# Patient Record
Sex: Male | Born: 1985 | Race: Black or African American | Hispanic: No | Marital: Single | Smoking: Never smoker
Health system: Southern US, Community
[De-identification: ages and names within clinical notes are randomized; demographics above are authoritative.]

## PROBLEM LIST (undated history)

## (undated) ENCOUNTER — Emergency Department (HOSPITAL_COMMUNITY): Admission: EM | Payer: 59 | Source: Home / Self Care

## (undated) DIAGNOSIS — E7800 Pure hypercholesterolemia, unspecified: Secondary | ICD-10-CM

## (undated) HISTORY — PX: URETHRA SURGERY: SHX824

---
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
## 2007-04-01 ENCOUNTER — Encounter: Admission: RE | Admit: 2007-04-01 | Discharge: 2007-04-01 | Payer: Self-pay | Admitting: Family Medicine

## 2008-01-06 ENCOUNTER — Emergency Department (HOSPITAL_COMMUNITY): Admission: EM | Admit: 2008-01-06 | Discharge: 2008-01-06 | Payer: Self-pay | Admitting: Emergency Medicine

## 2008-05-13 ENCOUNTER — Emergency Department (HOSPITAL_COMMUNITY): Admission: EM | Admit: 2008-05-13 | Discharge: 2008-05-13 | Payer: Self-pay | Admitting: Emergency Medicine

## 2008-11-02 IMAGING — US US SCROTUM
1 series · 14 of 25 positions shown · non-contrast
Comparison: none

CLINICAL DATA: Left testicular lump on physical exam. 
SCROTAL ULTRASOUND:
TECHNIQUE: Complete ultrasound examination of the testicles, epididymis, and other scrotal structures was performed.

[Series 1: us scrotum · 0.08mm/px · 14 of 58 slices shown]
[im 1/58]
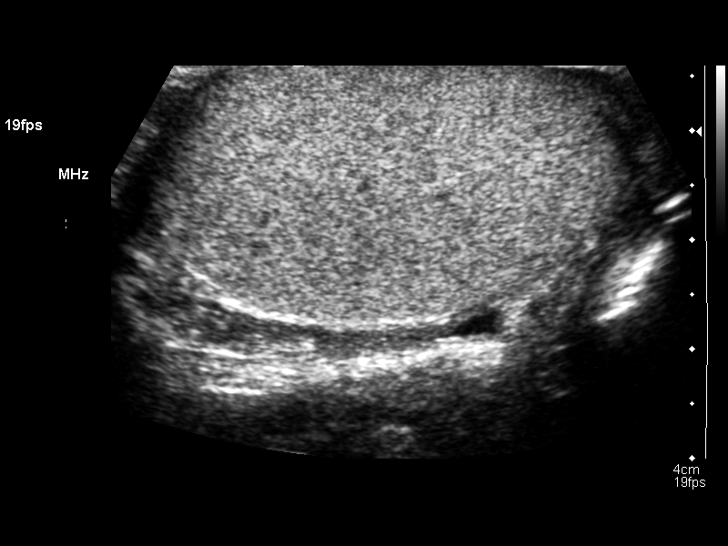
[im 5/58]
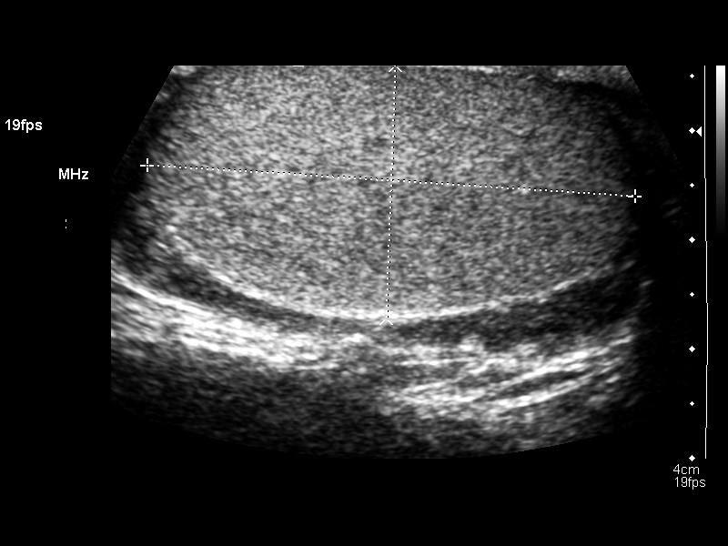
[im 10/58]
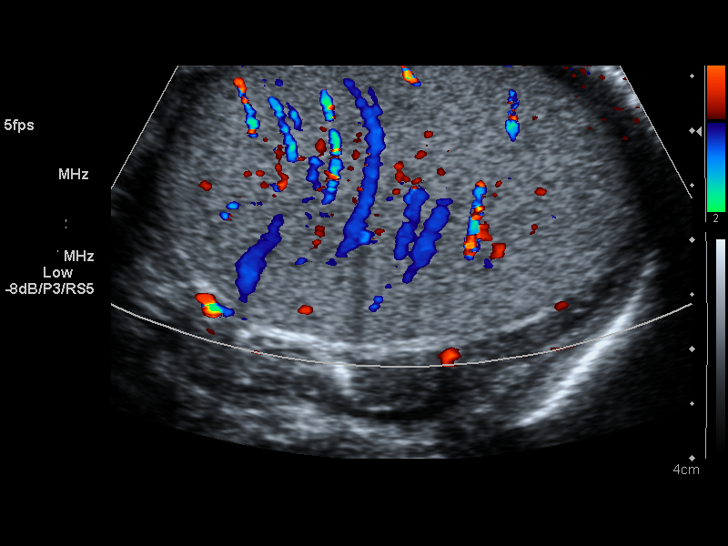
[im 15/58]
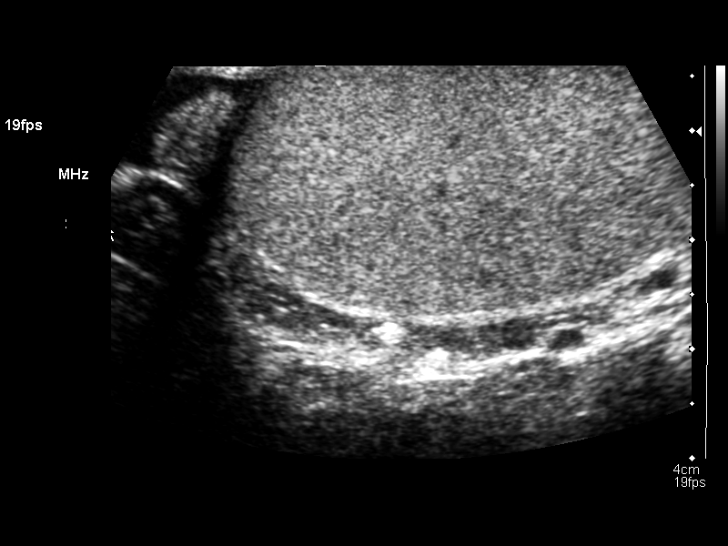
[im 20/58]
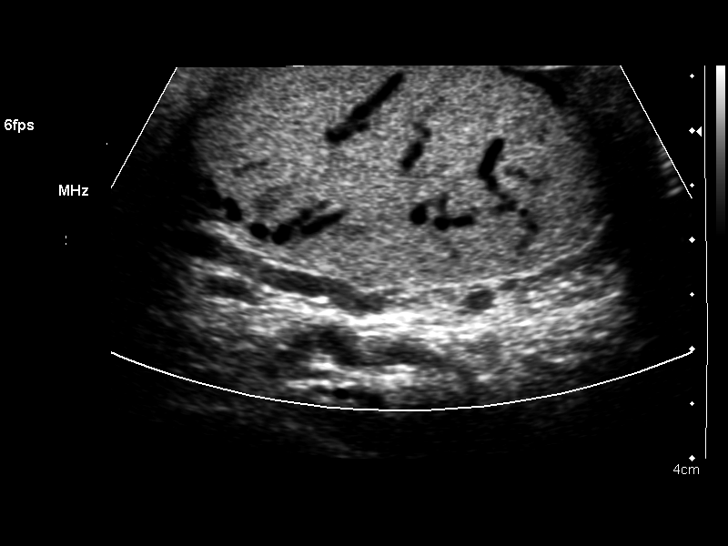
[im 22/58]
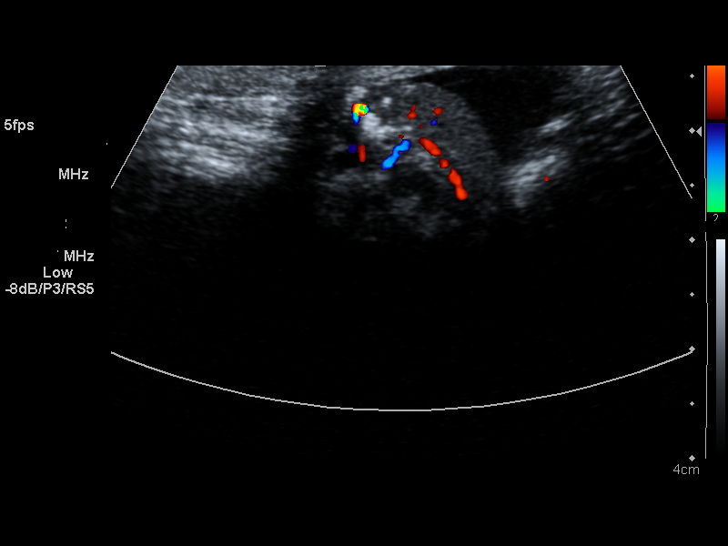
[im 27/58]
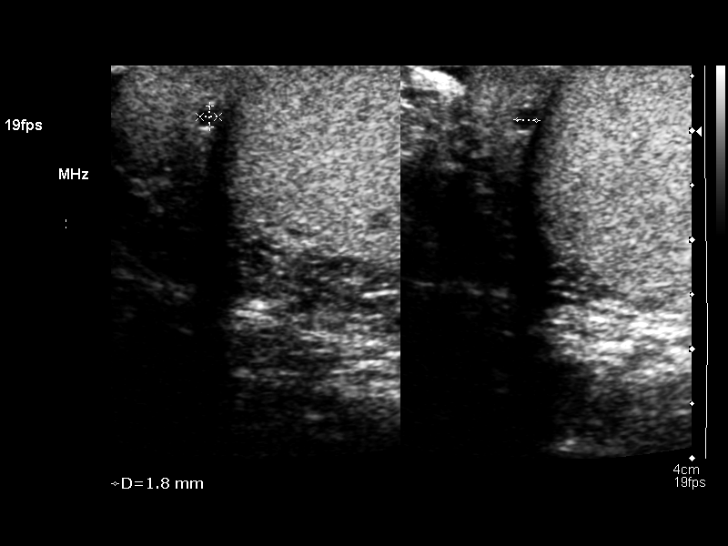
[im 31/58]
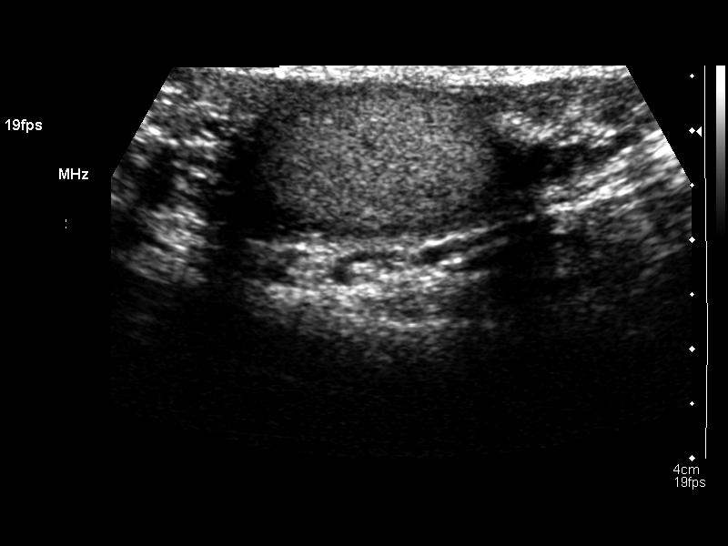
[im 36/58]
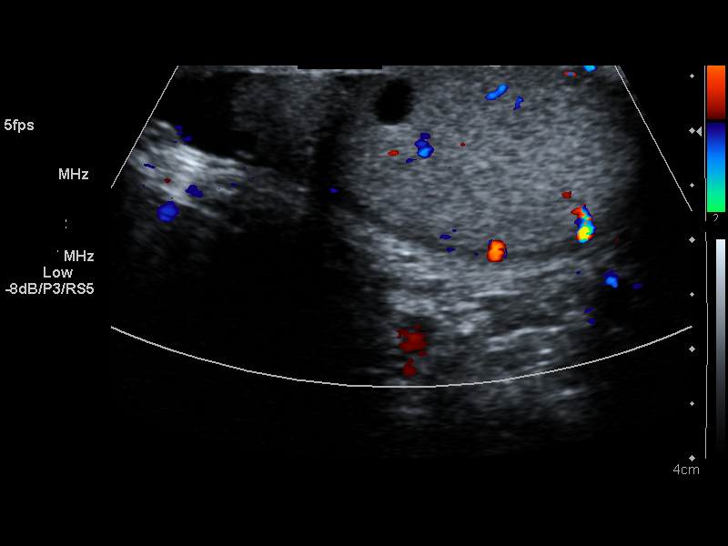
[im 39/58]
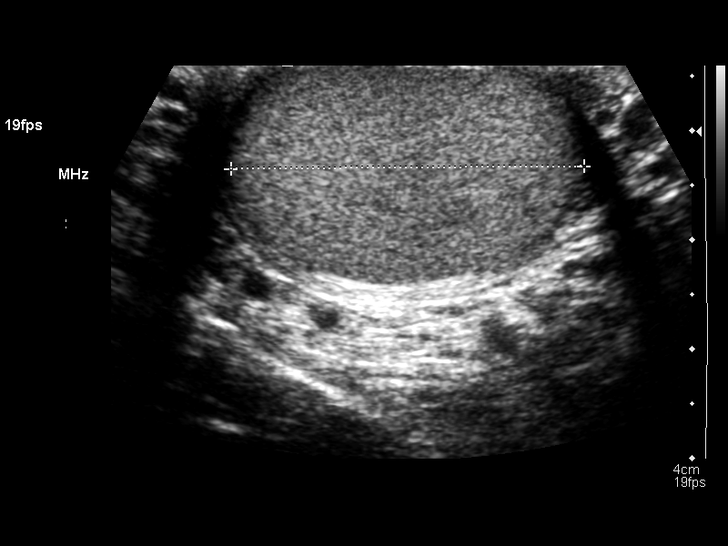
[im 43/58]
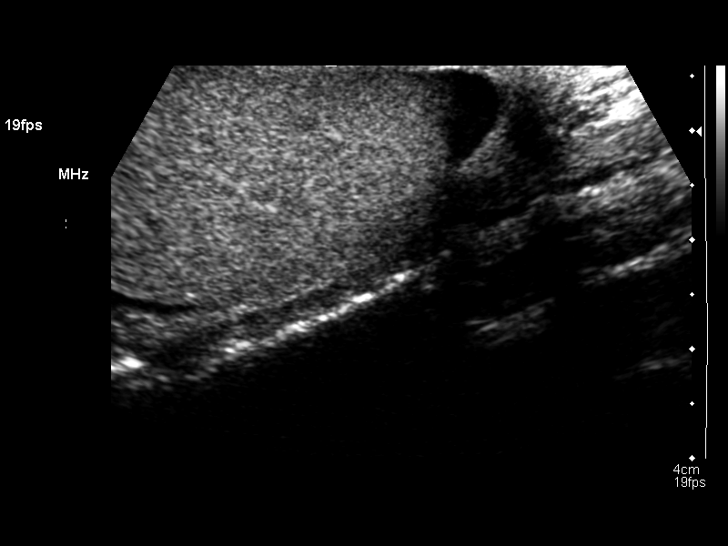
[im 48/58]
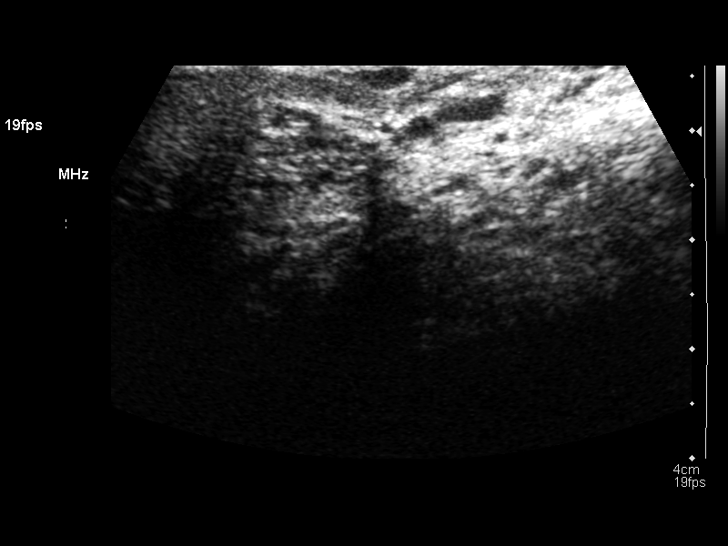
[im 53/58]
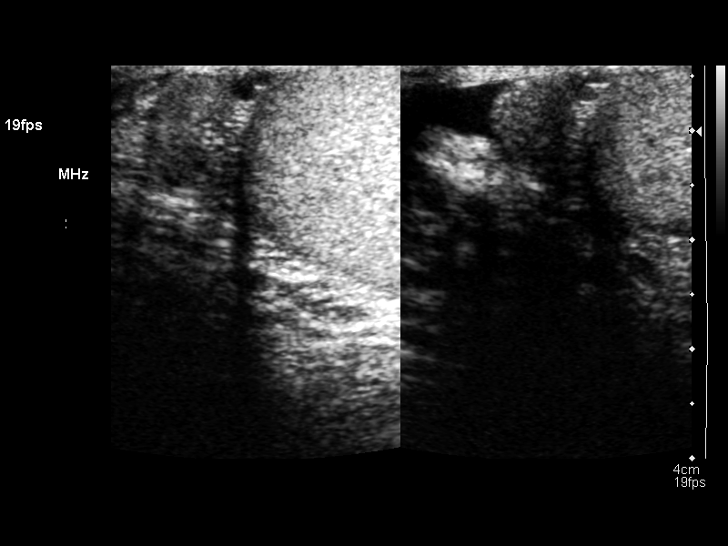
[im 58/58]
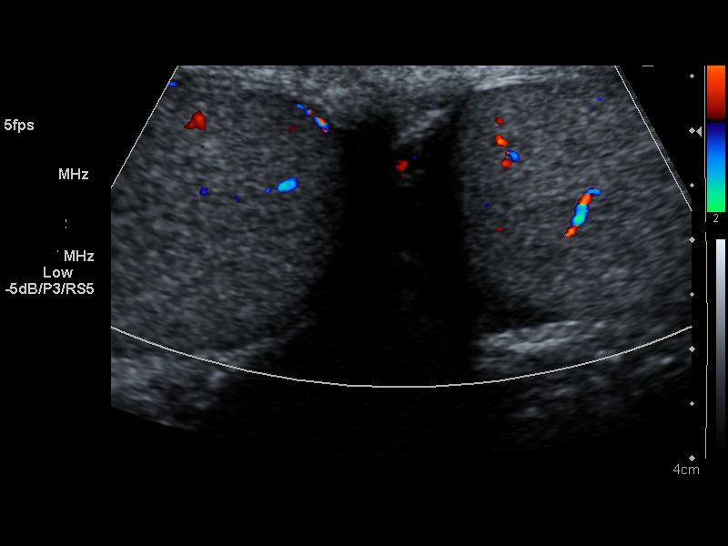

[14 of 25 positions shown; findings below may reference images not displayed]

FINDINGS: The testicles are normal in size and in echogenicity.  Blood flow is noted to both testicles.  Small epididymal cysts are noted. The area in question appears to correspond to either the left epididymal region and/or a tiny intratesticular cyst in the upper pole of the left testicle .  No hydrocele is seen and no varicocele is noted.
IMPRESSION: No intratesticular abnormality.  The palpable area questioned clinically on the left corresponds to either the left epididymis or a small intratesticular cyst on the left .  No solid lesion is seen.

## 2009-02-26 ENCOUNTER — Emergency Department (HOSPITAL_COMMUNITY): Admission: EM | Admit: 2009-02-26 | Discharge: 2009-02-26 | Payer: Self-pay | Admitting: Emergency Medicine

## 2009-03-13 ENCOUNTER — Emergency Department (HOSPITAL_COMMUNITY): Admission: EM | Admit: 2009-03-13 | Discharge: 2009-03-13 | Payer: Self-pay | Admitting: Emergency Medicine

## 2009-07-21 ENCOUNTER — Emergency Department (HOSPITAL_COMMUNITY): Admission: EM | Admit: 2009-07-21 | Discharge: 2009-07-21 | Payer: Self-pay | Admitting: Emergency Medicine

## 2011-01-04 ENCOUNTER — Emergency Department (HOSPITAL_COMMUNITY)
Admission: EM | Admit: 2011-01-04 | Discharge: 2011-01-05 | Disposition: A | Discharge status: Home / Self Care | Payer: 59 | Attending: Emergency Medicine | Admitting: Emergency Medicine

## 2011-01-04 DIAGNOSIS — N509 Disorder of male genital organs, unspecified: Secondary | ICD-10-CM | POA: Insufficient documentation

## 2011-01-04 DIAGNOSIS — I861 Scrotal varices: Secondary | ICD-10-CM | POA: Insufficient documentation

## 2011-01-05 ENCOUNTER — Emergency Department (HOSPITAL_COMMUNITY): Payer: 59

## 2011-01-05 LAB — URINALYSIS, ROUTINE W REFLEX MICROSCOPIC
Glucose, UA: NEGATIVE mg/dL
Hgb urine dipstick: NEGATIVE
Ketones, ur: NEGATIVE mg/dL
Leukocytes, UA: NEGATIVE
Nitrite: NEGATIVE
Specific Gravity, Urine: 1.036 — ABNORMAL HIGH (ref 1.005–1.030)
Urobilinogen, UA: 0.2 mg/dL (ref 0.0–1.0)
pH: 5.5 (ref 5.0–8.0)

## 2011-03-21 ENCOUNTER — Inpatient Hospital Stay (INDEPENDENT_AMBULATORY_CARE_PROVIDER_SITE_OTHER)
Admission: RE | Admit: 2011-03-21 | Discharge: 2011-03-21 | Disposition: A | Discharge status: Home / Self Care | Payer: 59 | Source: Ambulatory Visit | Attending: Family Medicine | Admitting: Family Medicine

## 2011-03-21 DIAGNOSIS — L03019 Cellulitis of unspecified finger: Secondary | ICD-10-CM

## 2012-05-31 ENCOUNTER — Encounter (HOSPITAL_COMMUNITY): Payer: Self-pay | Admitting: Emergency Medicine

## 2012-05-31 ENCOUNTER — Emergency Department (HOSPITAL_COMMUNITY)
Admission: EM | Admit: 2012-05-31 | Discharge: 2012-06-01 | Disposition: A | Discharge status: Home / Self Care | Payer: 59 | Attending: Emergency Medicine | Admitting: Emergency Medicine

## 2012-05-31 DIAGNOSIS — R5383 Other fatigue: Secondary | ICD-10-CM | POA: Insufficient documentation

## 2012-05-31 DIAGNOSIS — J3489 Other specified disorders of nose and nasal sinuses: Secondary | ICD-10-CM | POA: Insufficient documentation

## 2012-05-31 DIAGNOSIS — R059 Cough, unspecified: Secondary | ICD-10-CM | POA: Insufficient documentation

## 2012-05-31 DIAGNOSIS — J111 Influenza due to unidentified influenza virus with other respiratory manifestations: Secondary | ICD-10-CM | POA: Insufficient documentation

## 2012-05-31 DIAGNOSIS — E7800 Pure hypercholesterolemia, unspecified: Secondary | ICD-10-CM | POA: Insufficient documentation

## 2012-05-31 DIAGNOSIS — J029 Acute pharyngitis, unspecified: Secondary | ICD-10-CM | POA: Insufficient documentation

## 2012-05-31 DIAGNOSIS — R509 Fever, unspecified: Secondary | ICD-10-CM | POA: Insufficient documentation

## 2012-05-31 DIAGNOSIS — R05 Cough: Secondary | ICD-10-CM | POA: Insufficient documentation

## 2012-05-31 DIAGNOSIS — R197 Diarrhea, unspecified: Secondary | ICD-10-CM | POA: Insufficient documentation

## 2012-05-31 DIAGNOSIS — R5381 Other malaise: Secondary | ICD-10-CM | POA: Insufficient documentation

## 2012-05-31 DIAGNOSIS — R51 Headache: Secondary | ICD-10-CM | POA: Insufficient documentation

## 2012-05-31 DIAGNOSIS — R630 Anorexia: Secondary | ICD-10-CM | POA: Insufficient documentation

## 2012-05-31 HISTORY — DX: Pure hypercholesterolemia, unspecified: E78.00

## 2012-05-31 NOTE — ED Notes (Signed)
Patient c/o fever up to 102.1, nausea, diarrhea, sore throat, body aches, and cough since yesterday morning.

## 2012-05-31 NOTE — ED Notes (Signed)
Pt c/o body aches, fever, cough, sore throat, diarrhea, headache x 2 days. Family member with similar s/s.

## 2012-06-01 MED ORDER — OSELTAMIVIR PHOSPHATE 75 MG PO CAPS: 75.0000 mg | ORAL_CAPSULE | Freq: Two times a day (BID) | ORAL | Status: DC

## 2012-06-01 NOTE — ED Provider Notes (Signed)
History     CSN: [id number]  Arrival date & time 05/31/12  2150   First MD Initiated Contact with Patient 06/01/12 0026      Chief Complaint  Patient presents with  . Influenza    (Consider location/radiation/quality/duration/timing/severity/associated sxs/prior treatment) HPI Comments: Pt presents to the ED with complaints of flu-like symptoms of dry cough, congestion, sore throat, muscle aches, chills, fevers, ear pain, headaches and diarrhea. The patient states that the symptoms started yesterday.  Pt has been around other sick contacts and did not get the flu shot this year. The patient neck pain/stiffness, weakness, vision changes, severe abdominal pain, inability to eat or drink, difficulty breathing, SOB, wheezing, chest pain. The patient has tried cough medicine, NSAIDS, and rest but has only felt mild relief.   Patient is otherwise healthy and not immunocompromised.  Patient is a 26 y.o. male presenting with flu symptoms. The history is provided by the patient.  Influenza Associated symptoms include chills, coughing, fatigue, a fever and headaches. Pertinent negatives include no abdominal pain, chest pain, nausea, neck pain, rash or vomiting.    Past Medical History  Diagnosis Date  . High cholesterol     Past Surgical History  Procedure Date  . Urethra surgery     No family history on file.  History  Substance Use Topics  . Smoking status: Never Smoker   . Smokeless tobacco: Not on file  . Alcohol Use: Yes     Comment: occasional      Review of Systems  Constitutional: Positive for fever, chills, appetite change and fatigue.  HENT: Negative for neck pain and neck stiffness.   Respiratory: Positive for cough. Negative for shortness of breath and wheezing.   Cardiovascular: Negative for chest pain.  Gastrointestinal: Positive for diarrhea. Negative for nausea, vomiting, abdominal pain, constipation and blood in stool.  Skin: Negative for rash.    Neurological: Positive for headaches. Negative for dizziness, syncope and light-headedness.  Psychiatric/Behavioral: Negative for confusion.    Allergies  Review of patient's allergies indicates no known allergies.  Home Medications   Current Outpatient Rx  Name  Route  Sig  Dispense  Refill  . ACETAMINOPHEN 500 MG PO TABS   Oral   Take 500 mg by mouth every 6 (six) hours as needed. fever         . IBUPROFEN 200 MG PO TABS   Oral   Take 200 mg by mouth every 6 (six) hours as needed.         Marland Kitchen PSEUDOEPHEDRINE-APAP-DM 40-981-19 MG/30ML PO LIQD   Oral   Take 20 mLs by mouth once.         . TERBINAFINE HCL 250 MG PO TABS   Oral   Take 250 mg by mouth daily.           BP 133/69  Pulse 95  Temp 100.3 F (37.9 C) (Oral)  Resp 18  Ht 5\' 8"  (1.727 m)  Wt 200 lb (90.719 kg)  BMI 30.41 kg/m2  SpO2 97%  Physical Exam  Nursing note and vitals reviewed. Constitutional: He appears well-developed and well-nourished. No distress.  HENT:  Head: Normocephalic and atraumatic.  Right Ear: Tympanic membrane and ear canal normal.  Left Ear: Tympanic membrane and ear canal normal.  Nose: Rhinorrhea present. Right sinus exhibits no maxillary sinus tenderness and no frontal sinus tenderness. Left sinus exhibits no maxillary sinus tenderness and no frontal sinus tenderness.  Mouth/Throat: Uvula is midline, oropharynx is clear and moist  and mucous membranes are normal.  Neck: Normal range of motion. Neck supple.  Cardiovascular: Normal rate, regular rhythm and normal heart sounds.   Pulmonary/Chest: Effort normal and breath sounds normal. No respiratory distress. He has no wheezes. He has no rales.  Abdominal: Soft. There is no tenderness.  Musculoskeletal: Normal range of motion.  Neurological: He is alert.  Skin: Skin is warm and dry. No rash noted. He is not diaphoretic.  Psychiatric: He has a normal mood and affect.    ED Course  Procedures (including critical care  time)  Labs Reviewed - No data to display No results found.   No diagnosis found.    MDM  Patient with symptoms consistent with influenza.  Vitals are stable, low-grade fever.  No signs of dehydration, tolerating PO's.  Lungs are clear. Due to patient's presentation and physical exam a chest x-ray was not ordered bc likely diagnosis of flu.  Discussed the cost versus benefit of Tamiflu treatment with the patient.  Patient within 48 hour window.  Therefore, patient given prescription for Tamiflu.  Patient will be discharged with instructions to orally hydrate, rest, and use over-the-counter medications such as anti-inflammatories ibuprofen and Aleve for muscle aches and Tylenol for fever.  Patient will also be given a cough suppressant.         Pascal Lux Frederick, PA-C 06/02/12 2012

## 2012-06-02 NOTE — ED Provider Notes (Signed)
Medical screening examination/treatment/procedure(s) were performed by non-physician practitioner and as supervising physician I was immediately available for consultation/collaboration.  Sunnie Nielsen, MD 06/02/12 [phone]

## 2012-08-08 IMAGING — US US ART/VEN ABD/PELV/SCROTUM DOPPLER COMPLETE
1 series · 14 of 25 positions shown · non-contrast
Comparison: None.

CLINICAL DATA: Right scrotal pain and tenderness.

SCROTAL ULTRASOUND
DOPPLER ULTRASOUND OF THE TESTICLES
TECHNIQUE: Complete ultrasound examination of the testicles,
epididymis, and other scrotal structures was performed.  Color and
spectral Doppler ultrasound were also utilized to evaluate blood
flow to the testicles.

[Series 1: us art/ven abd/pelv/scrotum doppler complete · 0.08mm/px · 14 of 74 slices shown]
[im 1/74]
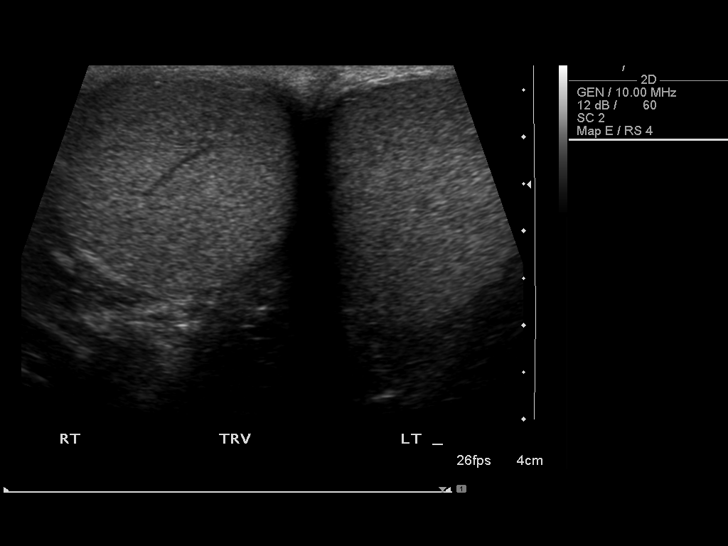
[im 7/74]
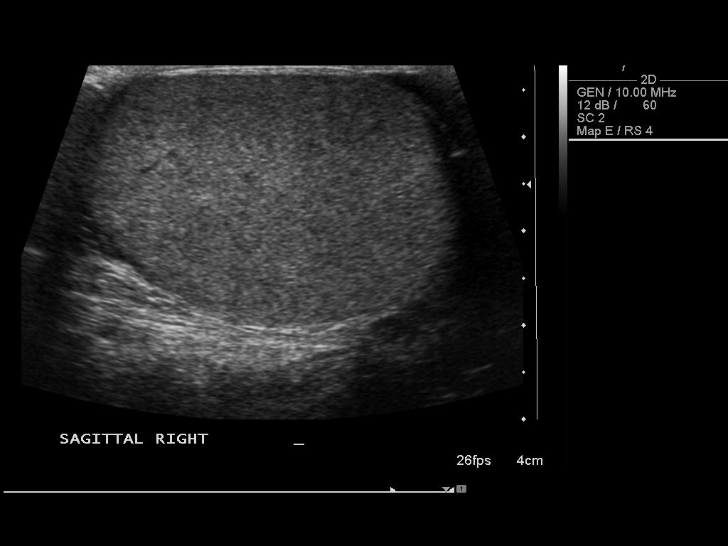
[im 13/74]
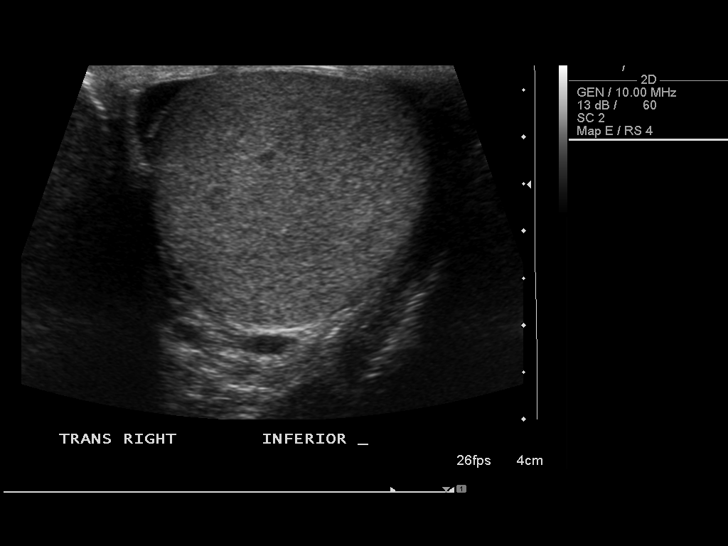
[im 19/74]
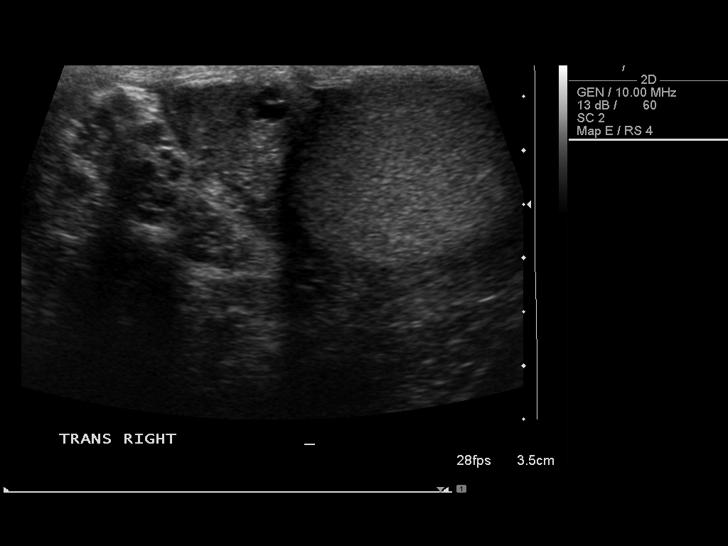
[im 25/74]
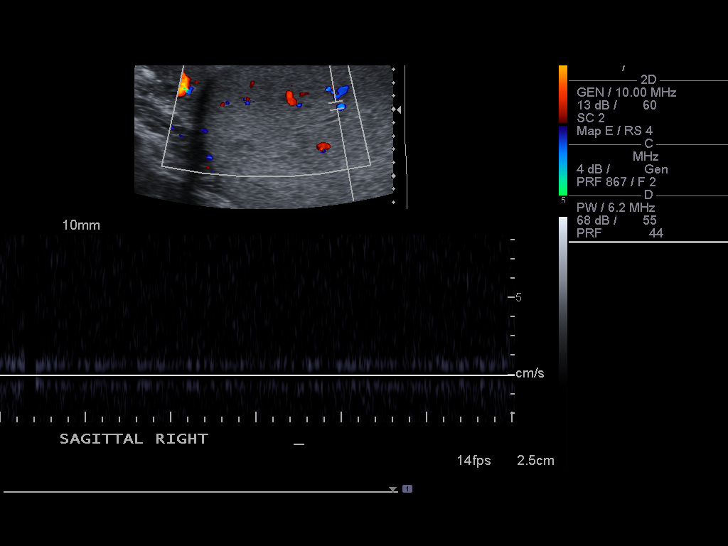
[im 28/74]
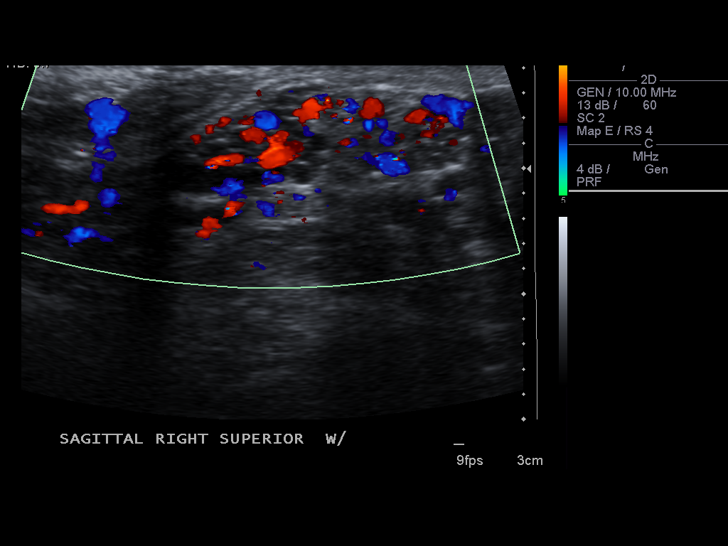
[im 34/74]
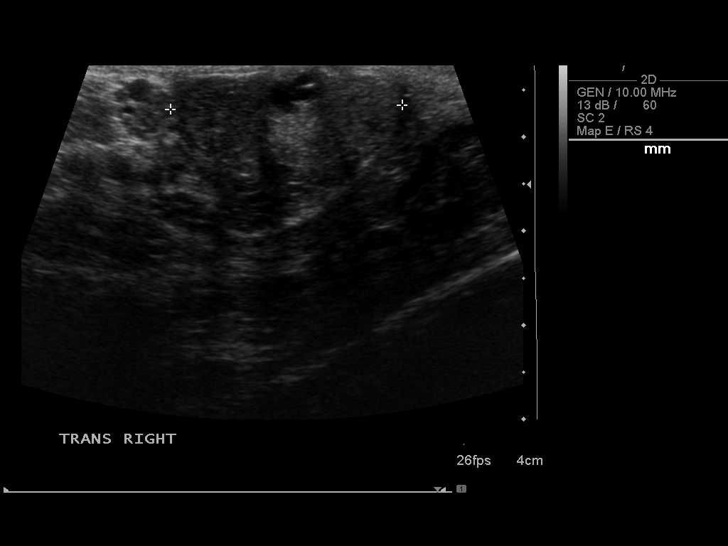
[im 40/74]
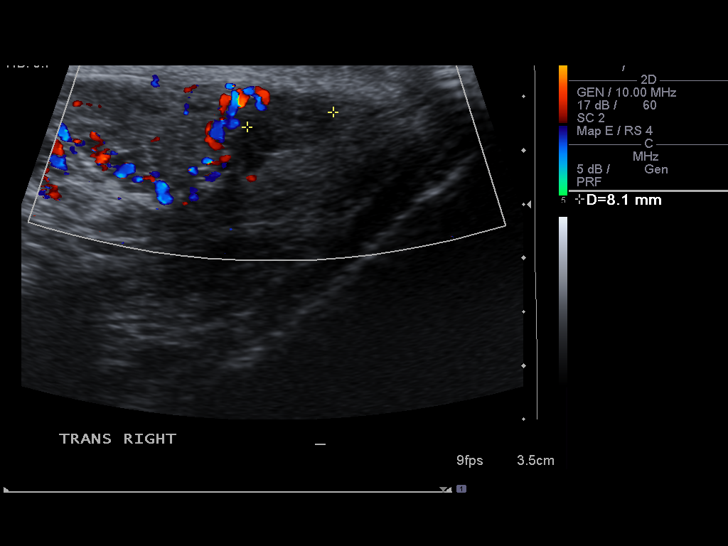
[im 46/74]
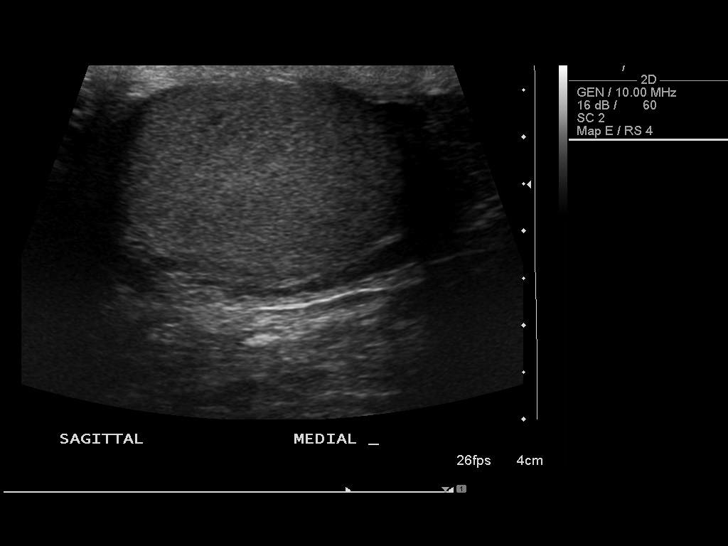
[im 49/74]
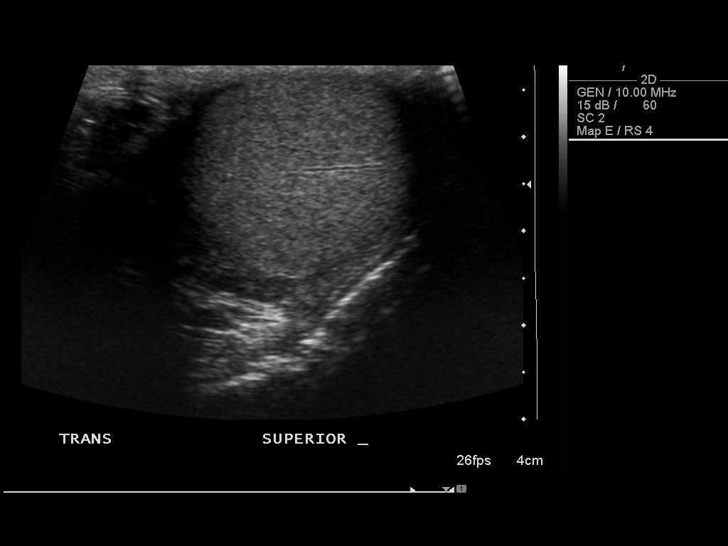
[im 55/74]
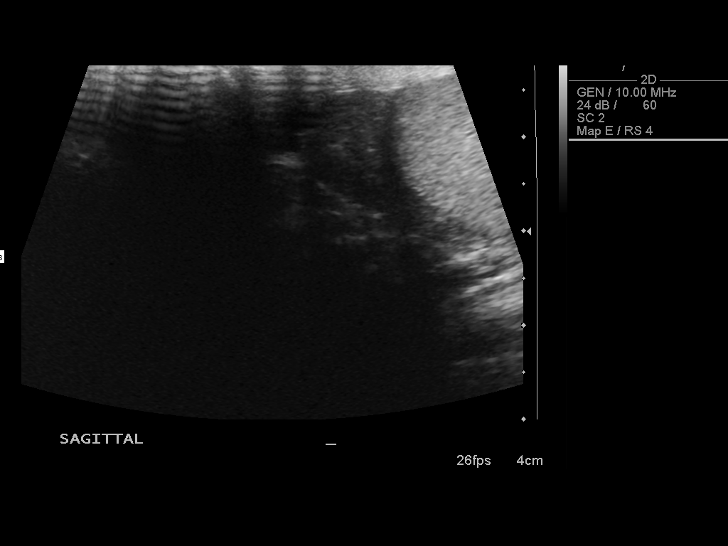
[im 61/74]
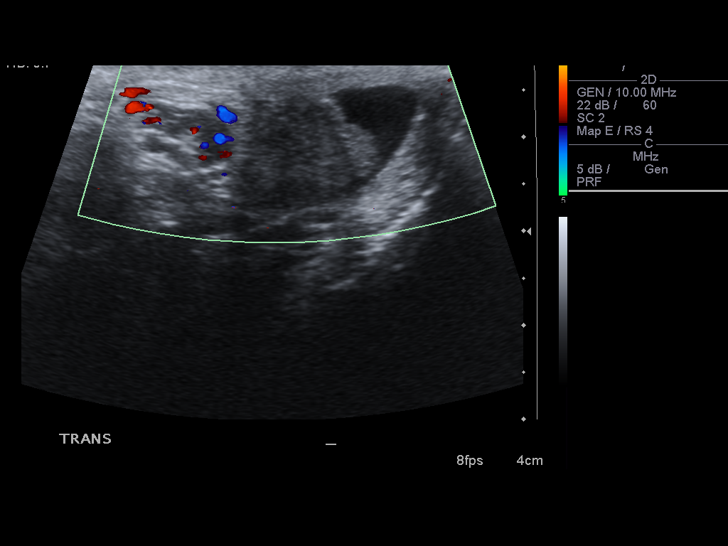
[im 67/74]
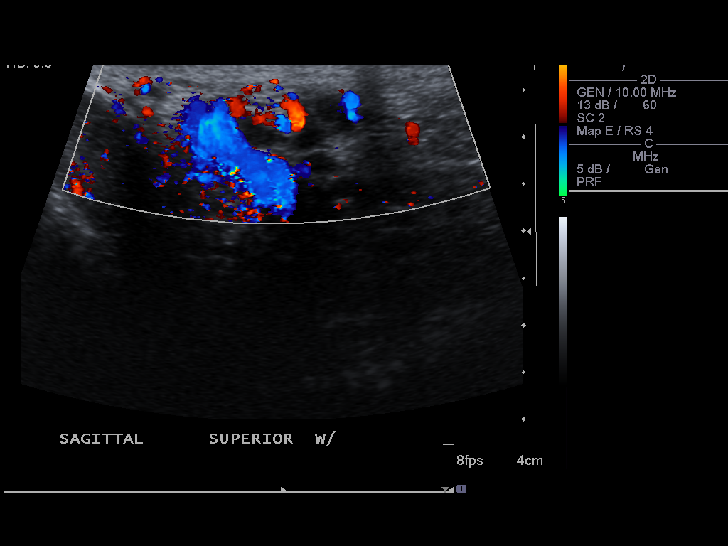
[im 74/74]
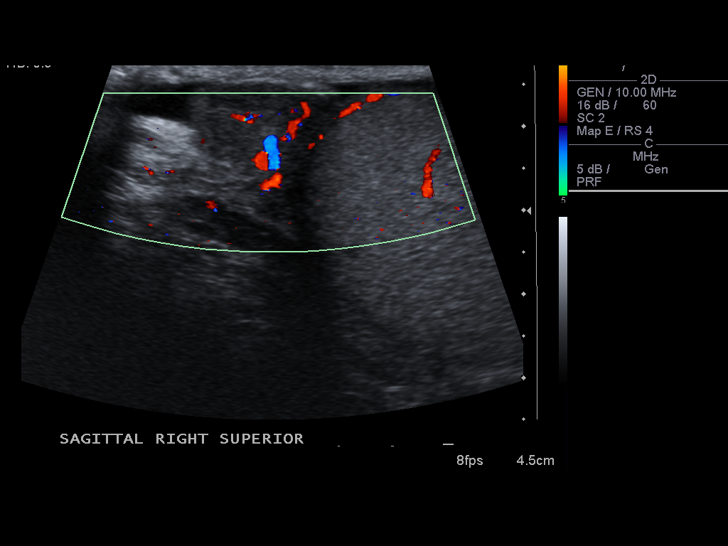

[14 of 25 positions shown; findings below may reference images not displayed]

FINDINGS: The testicles are symmetric in size and echogenicity.
No testicular masses are seen, and there is no evidence of
microlithiasis.

Several tiny cysts or spermatoceles are seen in the head of the
right epididymis, largest measuring 4 mm.

Small to moderate-sized varicoceles are seen bilaterally, which
show increased blood flow during Valsalva maneuver.

Blood flow is seen within both testicles on color Doppler
sonography.  Doppler spectral waveforms show both arterial and
venous flow signal in both testicles.
IMPRESSION: 1.  No evidence of testicular mass or torsion.
2.  Small to moderate bilateral varicoceles.
3.  Tiny right epididymal cysts or spermatoceles.

## 2013-10-29 ENCOUNTER — Ambulatory Visit: Payer: 59 | Admitting: Family Medicine

## 2013-10-29 VITALS — BP 112/74 | HR 54 | Temp 98.7°F | Resp 16 | Ht 69.0 in | Wt 187.6 lb

## 2013-10-29 DIAGNOSIS — E7800 Pure hypercholesterolemia, unspecified: Secondary | ICD-10-CM

## 2013-10-29 DIAGNOSIS — R0981 Nasal congestion: Secondary | ICD-10-CM

## 2013-10-29 DIAGNOSIS — H9203 Otalgia, bilateral: Secondary | ICD-10-CM

## 2013-10-29 DIAGNOSIS — J3489 Other specified disorders of nose and nasal sinuses: Secondary | ICD-10-CM

## 2013-10-29 DIAGNOSIS — H9209 Otalgia, unspecified ear: Secondary | ICD-10-CM

## 2013-10-29 MED ORDER — FLUTICASONE PROPIONATE 50 MCG/ACT NA SUSP: 2.0000 | Freq: Every day | NASAL | Status: DC

## 2013-10-29 NOTE — Progress Notes (Signed)
   Subjective:    Patient ID: Evan Randall, male    DOB: 07/31/1985, 28 y.o.   MRN: [id number]  HPI Patient presents today 3 days of otalgia in both ears. Has noticed some decreased hearing. This occurred after he was at the beach, he swam in the ocean. Has had similar problems in the past, these did not require treatment. He feels like he often has nasal congestion. He uses cotton swabs.  Had elevated cholesterol that was last checked about 1.5 years ago. He has lost 20 pounds and has been watching his diet. He would like it checked today.  PMH- none, tested and is negative for sickle cell trait PSH- urethral surgery as a child FH- parents alive and well, has 1 sister with SSA/crohn's disease, 1 brother with SSA, 1 sister with sicle cell trait. Has 64 year old son who has had a recent upper respiratory infection.   Review of Systems +nasal congestion, +cough (dark yellow sputum), - fever, ? Chills, no SOB, no CP, - sore throat    Objective:   Physical Exam  Vitals reviewed. Constitutional: He is oriented to person, place, and time. He appears well-developed and well-nourished.  HENT:  Head: Normocephalic and atraumatic.  Right Ear: External ear normal.  Left Ear: Tympanic membrane, external ear and ear canal normal.  Nose: Mucosal edema and rhinorrhea present.  Mouth/Throat: Uvula is midline and mucous membranes are normal. Posterior oropharyngeal erythema present.  Right Tm occluded with cerumen.   Eyes: Conjunctivae are normal. Right eye exhibits no discharge. Left eye exhibits no discharge. No scleral icterus.  Neck: Normal range of motion. Neck supple.  Cardiovascular: Normal rate, regular rhythm and normal heart sounds.   Pulmonary/Chest: Effort normal and breath sounds normal.  Musculoskeletal: Normal range of motion.  Lymphadenopathy:    He has no cervical adenopathy.  Neurological: He is alert and oriented to person, place, and time.  Skin: Skin is warm and dry.    Psychiatric: He has a normal mood and affect. His behavior is normal. Judgment and thought content normal.       Assessment & Plan:  1. Otalgia of both ears -right ear with cerumen occlusion- colace placed and ear irrigated with removal of most of cerumen.  -instructed on ear care, discontinue use of cotton swabs, can instill small amount mineral oil to loosen remaining cerumen 2. Nasal congestion - fluticasone (FLONASE) 50 MCG/ACT nasal spray; Place 2 sprays into both nostrils daily.  Dispense: 16 g; Refill: 6 -sudafed q 6-8 hours prn 3. Elevated cholesterol - Lipid panel   Emi Belfast, FNP-BC  [facility], [facility]  10/29/2013 12:08 PM

## 2013-10-29 NOTE — Patient Instructions (Signed)
Can use over the counter decongestant- Sudafed (generic ok) 1 tablet every 6-8 hours as needed. Do not take close to bedtime or it may keep you awake.

## 2013-10-30 ENCOUNTER — Encounter: Payer: Self-pay | Admitting: Family Medicine

## 2013-10-30 LAB — LIPID PANEL
CHOL/HDL RATIO: 3.5 ratio
Cholesterol: 157 mg/dL (ref 0–200)
HDL: 45 mg/dL (ref >39)
LDL Cholesterol: 103 mg/dL — ABNORMAL HIGH (ref 0–99)
Triglycerides: 47 mg/dL (ref <150)
VLDL: 9 mg/dL (ref 0–40)

## 2017-03-23 DIAGNOSIS — J358 Other chronic diseases of tonsils and adenoids: Secondary | ICD-10-CM | POA: Insufficient documentation

## 2018-08-06 ENCOUNTER — Emergency Department (HOSPITAL_COMMUNITY)
Admission: EM | Admit: 2018-08-06 | Discharge: 2018-08-07 | Disposition: A | Discharge status: Home / Self Care | Payer: Managed Care, Other (non HMO) | Attending: Emergency Medicine | Admitting: Emergency Medicine

## 2018-08-06 ENCOUNTER — Other Ambulatory Visit: Payer: Self-pay

## 2018-08-06 ENCOUNTER — Encounter (HOSPITAL_COMMUNITY): Payer: Self-pay | Admitting: *Deleted

## 2018-08-06 ENCOUNTER — Emergency Department (HOSPITAL_COMMUNITY): Payer: Managed Care, Other (non HMO)

## 2018-08-06 DIAGNOSIS — R079 Chest pain, unspecified: Secondary | ICD-10-CM | POA: Diagnosis present

## 2018-08-06 DIAGNOSIS — R0789 Other chest pain: Secondary | ICD-10-CM | POA: Insufficient documentation

## 2018-08-06 LAB — BASIC METABOLIC PANEL
ANION GAP: 8 (ref 5–15)
BUN: 20 mg/dL (ref 6–20)
CALCIUM: 9.1 mg/dL (ref 8.9–10.3)
CO2: 22 mmol/L (ref 22–32)
CREATININE: 0.92 mg/dL (ref 0.61–1.24)
Chloride: 109 mmol/L (ref 98–111)
Glucose, Bld: 110 mg/dL — ABNORMAL HIGH (ref 70–99)
Potassium: 3.8 mmol/L (ref 3.5–5.1)
Sodium: 139 mmol/L (ref 135–145)

## 2018-08-06 LAB — CBC
HCT: 44.5 % (ref 39.0–52.0)
Hemoglobin: 14.3 g/dL (ref 13.0–17.0)
MCH: 27.3 pg (ref 26.0–34.0)
MCHC: 32.1 g/dL (ref 30.0–36.0)
MCV: 85.1 fL (ref 80.0–100.0)
NRBC: 0 % (ref 0.0–0.2)
PLATELETS: 178 10*3/uL (ref 150–400)
RBC: 5.23 MIL/uL (ref 4.22–5.81)
RDW: 12.5 % (ref 11.5–15.5)
WBC: 7.2 10*3/uL (ref 4.0–10.5)

## 2018-08-06 LAB — POCT I-STAT TROPONIN I: Troponin i, poc: 0 ng/mL (ref 0.00–0.08)

## 2018-08-06 MED ORDER — SODIUM CHLORIDE 0.9% FLUSH: 3.0000 mL | Freq: Once | INTRAVENOUS | Status: DC

## 2018-08-06 NOTE — ED Triage Notes (Signed)
Pt reports central chest pain and left sided chest pain for "several months" No prior evaluation. Pain has been occurring more often, so this brought pt in tonight. Pt says that he will have a tightness in his chest, feel lightheaded, mid back pain and have some numbness in the left hand, lasting seconds at a time and occurring several times a day. No meds for the same.

## 2018-08-07 NOTE — ED Provider Notes (Signed)
[facility]-EMERGENCY DEPT Provider Note: Evan Dell, MD, FACEP  CSN: [id number] MRN: [id number] ARRIVAL: 08/06/18 at 2223 ROOM: WA19/WA19   CHIEF COMPLAINT  Chest Pain   HISTORY OF PRESENT ILLNESS  08/07/18 1:16 AM Evan Randall is a 33 y.o. male who is had several months of intermittent chest pain.  The chest pain occurs on the left side.  It is dull pain.  It lasts only a couple of seconds at a time.  It happens several times a day.  Nothing seems to bring it on.  It is not associated with shortness of breath, diaphoresis or nausea.  He does sometimes feel lightheaded.  He has also been having some discomfort in his neck and upper back which she attributes to muscle strain due to stress.   Past Medical History:  Diagnosis Date  . High cholesterol     Past Surgical History:  Procedure Laterality Date  . URETHRA SURGERY      No family history on file.  Social History   Tobacco Use  . Smoking status: Never Smoker  Substance Use Topics  . Alcohol use: Yes    Comment: occasional  . Drug use: Not Currently    Types: Marijuana    Prior to Admission medications   Medication Sig Start Date End Date Taking? Authorizing Provider  fluticasone (FLONASE) 50 MCG/ACT nasal spray Place 2 sprays into both nostrils daily. Patient not taking: Reported on 08/07/2018 10/29/13   Emi Belfast, FNP    Allergies Patient has no known allergies.   REVIEW OF SYSTEMS  Negative except as noted here or in the History of Present Illness.   PHYSICAL EXAMINATION  Initial Vital Signs Blood pressure 135/71, pulse 62, temperature 98.2 F (36.8 C), temperature source Oral, resp. rate 19, height 5\' 8"  (1.727 m), weight 95.3 kg, SpO2 99 %.  Examination General: Well-developed, well-nourished male in no acute distress; appearance consistent with age of record HENT: normocephalic; atraumatic Eyes: pupils equal, round and reactive to light; extraocular muscles intact Neck: supple Heart:  regular rate and rhythm; no murmur Lungs: clear to auscultation bilaterally Abdomen: soft; nondistended; nontender; bowel sounds present Extremities: No deformity; full range of motion Neurologic: Awake, alert and oriented; motor function intact in all extremities and symmetric; no facial droop Skin: Warm and dry Psychiatric: Normal mood and affect   RESULTS  Summary of this visit's results, reviewed by myself:   EKG Interpretation  Date/Time:  Tuesday August 06 2018 23:03:04 EST Ventricular Rate:  60 PR Interval:    QRS Duration: 101 QT Interval:  376 QTC Calculation: 376 R Axis:   66 Text Interpretation:  Sinus rhythm Normal ECG No significant change was found Confirmed by Azelie Noguera, Jonny Ruiz ([id number]) on 08/06/2018 11:22:11 PM      Laboratory Studies: Results for orders placed or performed during the hospital encounter of 08/06/18 (from the past 24 hour(s))  Basic metabolic panel     Status: Abnormal   Collection Time: 08/06/18 11:02 PM  Result Value Ref Range   Sodium 139 135 - 145 mmol/L   Potassium 3.8 3.5 - 5.1 mmol/L   Chloride 109 98 - 111 mmol/L   CO2 22 22 - 32 mmol/L   Glucose, Bld 110 (H) 70 - 99 mg/dL   BUN 20 6 - 20 mg/dL   Creatinine, Ser 8.61 0.61 - 1.24 mg/dL   Calcium 9.1 8.9 - 68.3 mg/dL   GFR calc non Af Amer >60 >60 mL/min   GFR calc Af Amer >  60 >60 mL/min   Anion gap 8 5 - 15  CBC     Status: None   Collection Time: 08/06/18 11:02 PM  Result Value Ref Range   WBC 7.2 4.0 - 10.5 K/uL   RBC 5.23 4.22 - 5.81 MIL/uL   Hemoglobin 14.3 13.0 - 17.0 g/dL   HCT 11.9 41.7 - 40.8 %   MCV 85.1 80.0 - 100.0 fL   MCH 27.3 26.0 - 34.0 pg   MCHC 32.1 30.0 - 36.0 g/dL   RDW 14.4 81.8 - 56.3 %   Platelets 178 150 - 400 K/uL   nRBC 0.0 0.0 - 0.2 %  POCT i-Stat troponin I     Status: None   Collection Time: 08/06/18 11:10 PM  Result Value Ref Range   Troponin i, poc 0.00 0.00 - 0.08 ng/mL   Comment 3           Imaging Studies: Dg Chest 2 View  Result Date:  08/06/2018 CLINICAL DATA:  Chest pain EXAM: CHEST - 2 VIEW COMPARISON:  03/13/2009 FINDINGS: The heart size and mediastinal contours are within normal limits. Both lungs are clear. The visualized skeletal structures are unremarkable. IMPRESSION: No active cardiopulmonary disease. Electronically Signed   By: Jasmine Pang M.D.   On: 08/06/2018 22:47    ED COURSE and MDM  Nursing notes and initial vitals signs, including pulse oximetry, reviewed.  Vitals:   08/06/18 2254 08/06/18 2256 08/07/18 0000 08/07/18 0030  BP:  (!) 153/82 129/84 135/71  Pulse:  69 61 62  Resp:  18 (!) 23 19  Temp:  98.2 F (36.8 C)    TempSrc:  Oral    SpO2:  99% 97% 99%  Weight: 95.3 kg     Height: 5\' 8"  (1.727 m)      Although patient's symptoms suggest PVCs his monitor strip was reviewed and no ectopy was seen.  We will refer to cardiology for possible long-term monitoring.  I have a low index of suspicion for acute coronary syndrome.  PROCEDURES    ED DIAGNOSES     ICD-10-CM   1. Atypical chest pain R07.89        Paula Libra, MD 08/07/18 [phone]

## 2019-07-08 ENCOUNTER — Ambulatory Visit: Payer: Managed Care, Other (non HMO) | Attending: Internal Medicine

## 2019-07-08 DIAGNOSIS — Z20822 Contact with and (suspected) exposure to covid-19: Secondary | ICD-10-CM

## 2019-07-09 LAB — NOVEL CORONAVIRUS, NAA: SARS-CoV-2, NAA: NOT DETECTED

## 2019-11-05 ENCOUNTER — Other Ambulatory Visit: Payer: Self-pay

## 2019-11-05 ENCOUNTER — Ambulatory Visit
Admission: RE | Admit: 2019-11-05 | Discharge: 2019-11-05 | Disposition: A | Discharge status: Home / Self Care | Payer: Managed Care, Other (non HMO) | Source: Ambulatory Visit | Attending: Family Medicine | Admitting: Family Medicine

## 2019-11-05 ENCOUNTER — Other Ambulatory Visit: Payer: Self-pay | Admitting: Family Medicine

## 2019-11-05 DIAGNOSIS — R059 Cough, unspecified: Secondary | ICD-10-CM

## 2020-02-16 DIAGNOSIS — J309 Allergic rhinitis, unspecified: Secondary | ICD-10-CM | POA: Insufficient documentation

## 2020-03-09 IMAGING — CR DG CHEST 2V
2 series · 2 of 2 positions shown · non-contrast
Comparison: 03/13/2009

CLINICAL DATA: Chest pain

EXAM:
CHEST - 2 VIEW

[w chest pa]
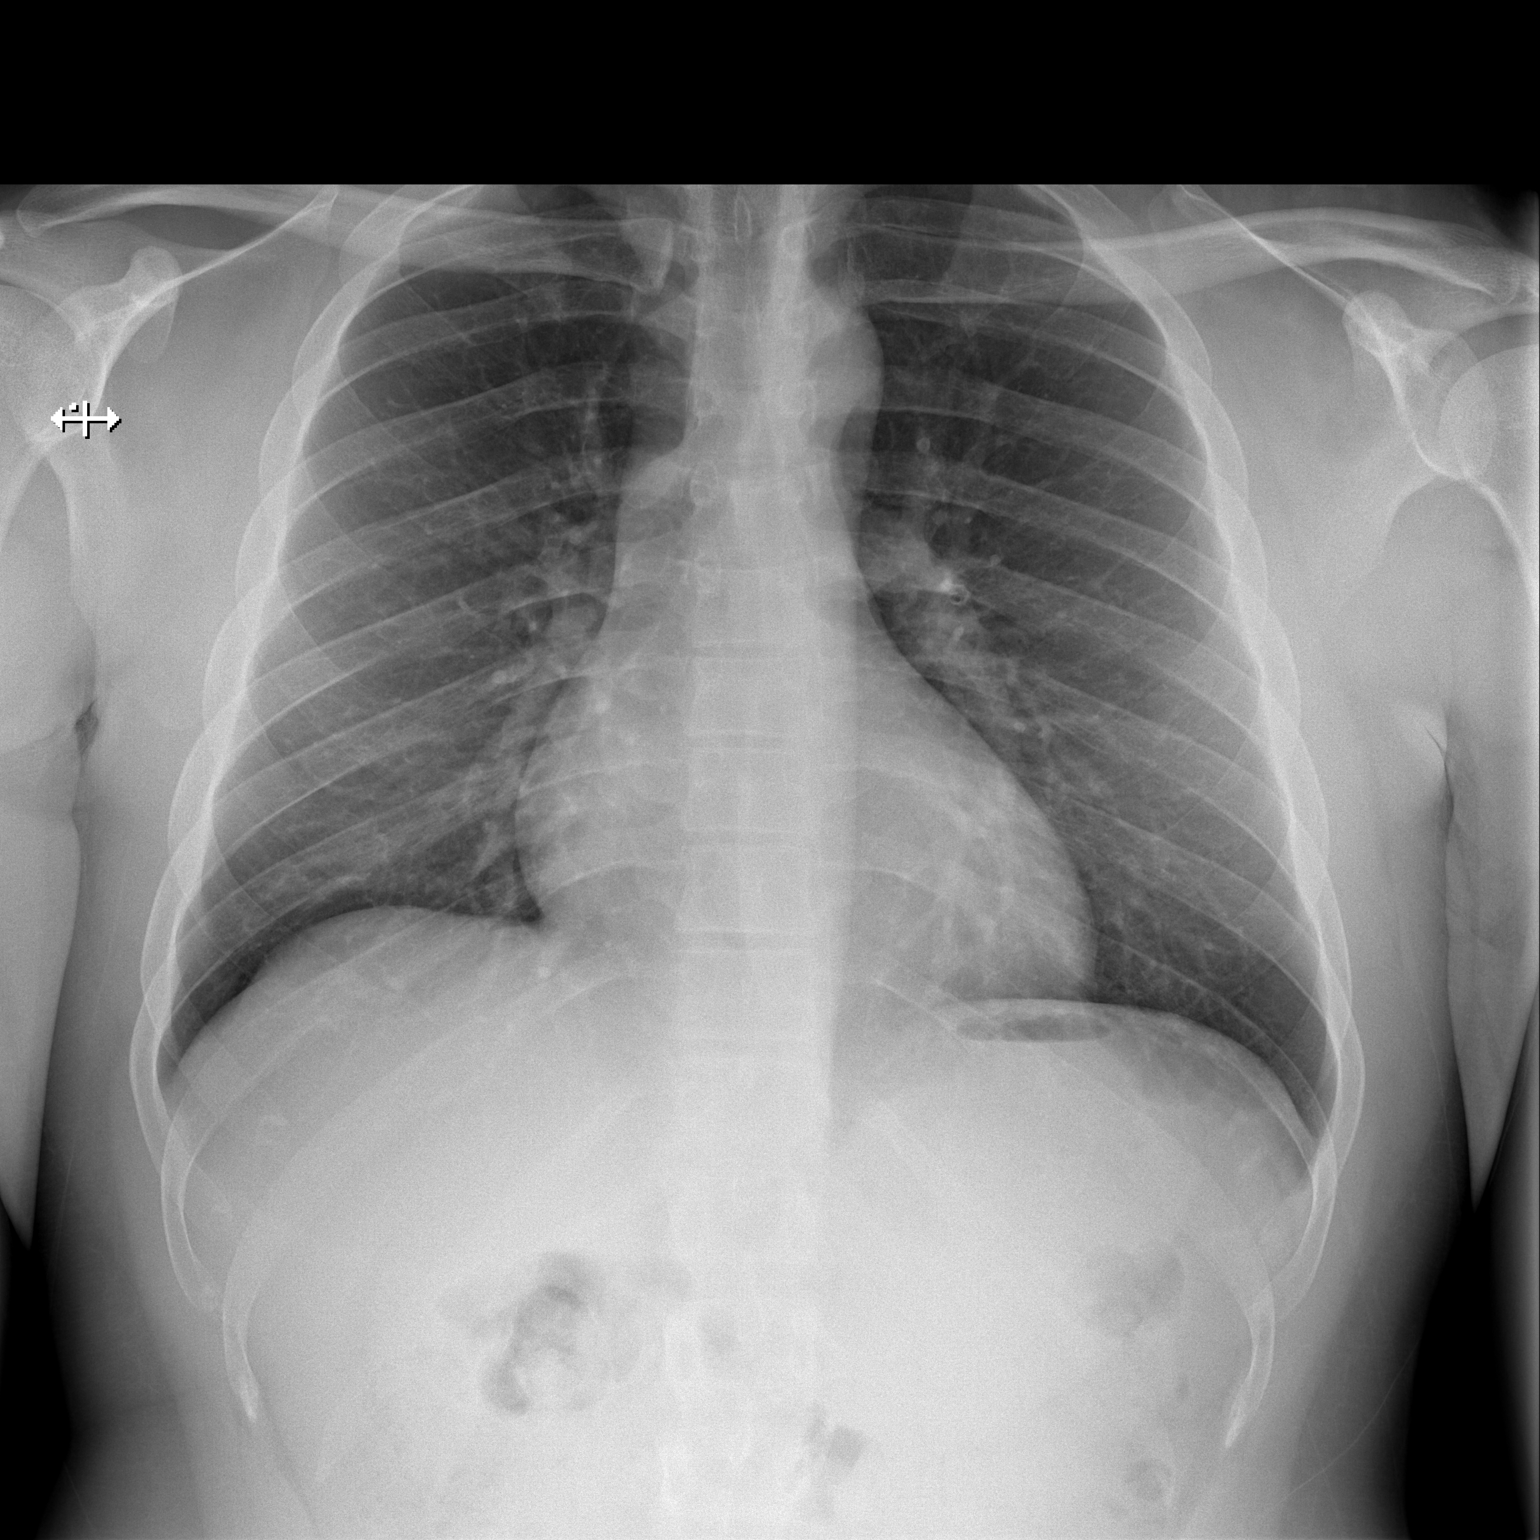

[w chest lat]
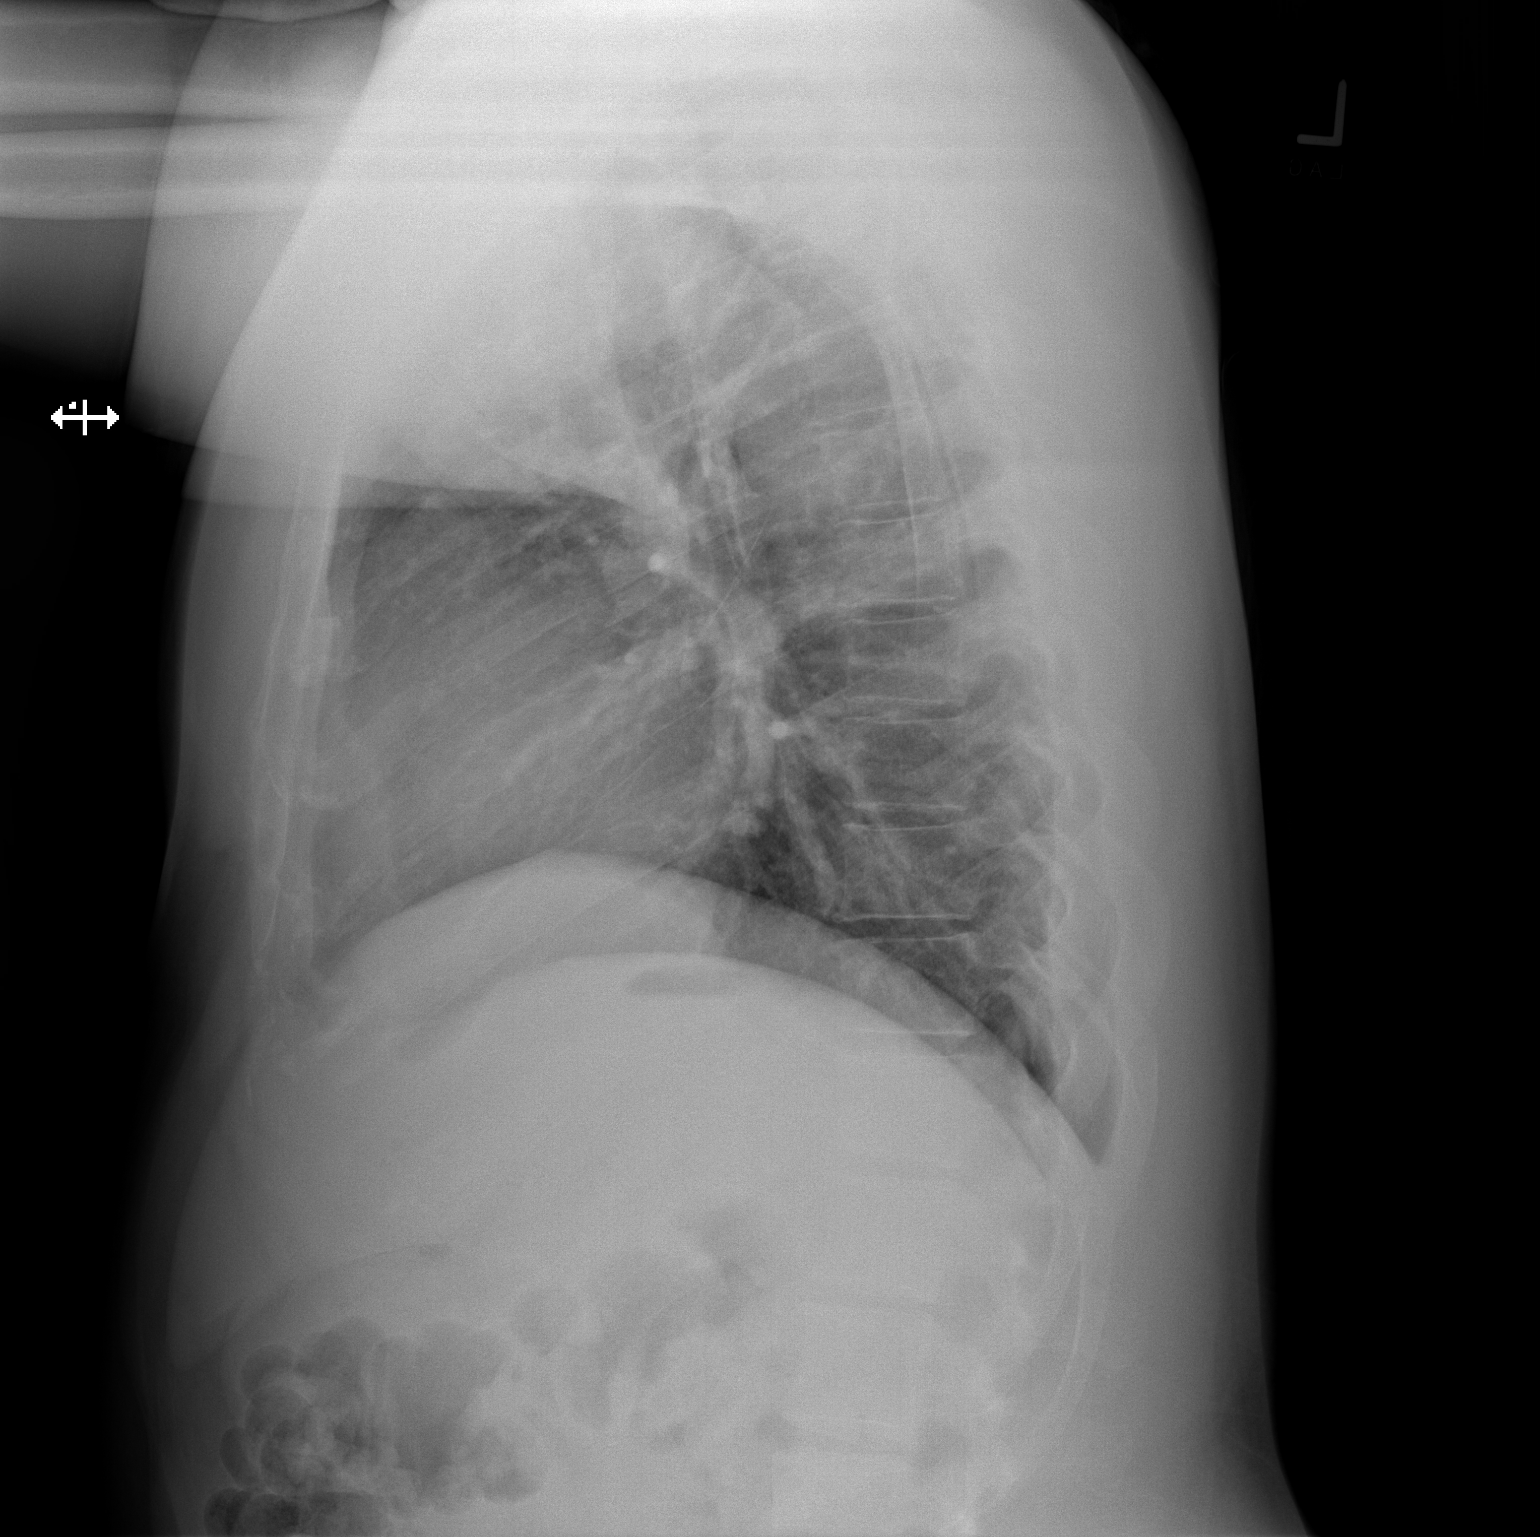

[2 of 2 positions shown; findings below may reference images not displayed]

FINDINGS: The heart size and mediastinal contours are within normal limits.
Both lungs are clear. The visualized skeletal structures are
unremarkable.
IMPRESSION: No active cardiopulmonary disease.

## 2020-08-09 ENCOUNTER — Ambulatory Visit: Payer: Managed Care, Other (non HMO) | Admitting: Registered"

## 2021-01-25 ENCOUNTER — Emergency Department (HOSPITAL_COMMUNITY)
Admission: EM | Admit: 2021-01-25 | Discharge: 2021-01-26 | Disposition: A | Discharge status: Home / Self Care | Payer: Self-pay | Attending: Emergency Medicine | Admitting: Emergency Medicine

## 2021-01-25 ENCOUNTER — Emergency Department (HOSPITAL_COMMUNITY): Payer: Self-pay

## 2021-01-25 ENCOUNTER — Other Ambulatory Visit: Payer: Self-pay

## 2021-01-25 ENCOUNTER — Encounter (HOSPITAL_COMMUNITY): Payer: Self-pay

## 2021-01-25 DIAGNOSIS — R079 Chest pain, unspecified: Secondary | ICD-10-CM | POA: Insufficient documentation

## 2021-01-25 DIAGNOSIS — R0602 Shortness of breath: Secondary | ICD-10-CM | POA: Insufficient documentation

## 2021-01-25 LAB — BASIC METABOLIC PANEL
Anion gap: 10 (ref 5–15)
BUN: 12 mg/dL (ref 6–20)
CO2: 23 mmol/L (ref 22–32)
Calcium: 9.6 mg/dL (ref 8.9–10.3)
Chloride: 102 mmol/L (ref 98–111)
Creatinine, Ser: 0.93 mg/dL (ref 0.61–1.24)
GFR, Estimated: >60 mL/min (ref >60)
Glucose, Bld: 80 mg/dL (ref 70–99)
Potassium: 3.5 mmol/L (ref 3.5–5.1)
Sodium: 135 mmol/L (ref 135–145)

## 2021-01-25 LAB — CBC WITH DIFFERENTIAL/PLATELET
Abs Immature Granulocytes: 0.01 10*3/uL (ref 0.00–0.07)
Basophils Absolute: 0 10*3/uL (ref 0.0–0.1)
Basophils Relative: 0 %
Eosinophils Absolute: 0.1 10*3/uL (ref 0.0–0.5)
Eosinophils Relative: 1 %
HCT: 45.3 % (ref 39.0–52.0)
Hemoglobin: 15.2 g/dL (ref 13.0–17.0)
Immature Granulocytes: 0 %
Lymphocytes Relative: 31 %
Lymphs Abs: 2.3 10*3/uL (ref 0.7–4.0)
MCH: 27.6 pg (ref 26.0–34.0)
MCHC: 33.6 g/dL (ref 30.0–36.0)
MCV: 82.2 fL (ref 80.0–100.0)
Monocytes Absolute: 0.4 10*3/uL (ref 0.1–1.0)
Monocytes Relative: 6 %
Neutro Abs: 4.7 10*3/uL (ref 1.7–7.7)
Neutrophils Relative %: 62 %
Platelets: 231 10*3/uL (ref 150–400)
RBC: 5.51 MIL/uL (ref 4.22–5.81)
RDW: 12.2 % (ref 11.5–15.5)
WBC: 7.6 10*3/uL (ref 4.0–10.5)
nRBC: 0 % (ref 0.0–0.2)

## 2021-01-25 LAB — TROPONIN I (HIGH SENSITIVITY)
Troponin I (High Sensitivity): <2 ng/L (ref <18)
Troponin I (High Sensitivity): 2 ng/L (ref <18)

## 2021-01-25 NOTE — ED Provider Notes (Signed)
[facility]-EMERGENCY DEPT Provider Note   CSN: [id number] Arrival date & time: 01/25/21  1744     History Chief Complaint  Patient presents with   Chest Pain   Shortness of Breath    Evan Randall is a 35 y.o. male.  35 year old male with a history of hyperlipidemia presents to the emergency department for complaints of chest pain.  He reports 8 fleeting sharp pain in his left chest which he has been experiencing sporadically since 2020.  Reports that symptoms became more frequent about 2 days ago.  Pain will occasionally radiate to his left arm.  He has had a nonspecific "funny" feeling in his L hand at times.  Denies taking any medications for his pain.  Unsure of what makes his symptoms better, but has some worsening with activity or movement.  Denies any fever, hemoptysis, nausea, vomiting, leg swelling, recent surgeries or hospitalizations, use of hormone replacement.  No personal or family history of blood clots.  He is presently completing a 7-day fast.  The history is provided by the patient. No language interpreter was used.  Chest Pain Associated symptoms: shortness of breath   Shortness of Breath Associated symptoms: chest pain       Past Medical History:  Diagnosis Date   High cholesterol     There are no problems to display for this patient.   Past Surgical History:  Procedure Laterality Date   URETHRA SURGERY         Family History  Problem Relation Age of Onset   Thyroid disease Mother    Cancer Sister     Social History   Tobacco Use   Smoking status: Never   Smokeless tobacco: Never  Vaping Use   Vaping Use: Never used  Substance Use Topics   Alcohol use: Yes    Comment: occasional   Drug use: Not Currently    Types: Marijuana    Home Medications Prior to Admission medications   Not on File    Allergies    Patient has no known allergies.  Review of Systems   Review of Systems  Respiratory:  Positive for  shortness of breath.   Cardiovascular:  Positive for chest pain.  Ten systems reviewed and are negative for acute change, except as noted in the HPI.    Physical Exam Updated Vital Signs BP 129/84   Pulse (!) 52   Temp 99 F (37.2 C)   Resp (!) 22   Ht 5\' 8"  (1.727 m)   Wt 89.8 kg   SpO2 99%   BMI 30.11 kg/m   Physical Exam Vitals and nursing note reviewed.  Constitutional:      General: He is not in acute distress.    Appearance: He is well-developed. He is not diaphoretic.     Comments: Nontoxic appearing and in NAD  HENT:     Head: Normocephalic and atraumatic.  Eyes:     General: No scleral icterus.    Conjunctiva/sclera: Conjunctivae normal.  Cardiovascular:     Rate and Rhythm: Normal rate and regular rhythm.     Pulses: Normal pulses.  Pulmonary:     Effort: Pulmonary effort is normal. No respiratory distress.     Breath sounds: No stridor. No wheezing.     Comments: Respirations even and unlabored. Lungs CTAB. Musculoskeletal:        General: Normal range of motion.     Cervical back: Normal range of motion.  Skin:  General: Skin is warm and dry.     Coloration: Skin is not pale.     Findings: No erythema or rash.  Neurological:     Mental Status: He is alert and oriented to person, place, and time.     Coordination: Coordination normal.  Psychiatric:        Behavior: Behavior normal.    ED Results / Procedures / Treatments   Labs (all labs ordered are listed, but only abnormal results are displayed) Labs Reviewed  BASIC METABOLIC PANEL  CBC WITH DIFFERENTIAL/PLATELET  TROPONIN I (HIGH SENSITIVITY)  TROPONIN I (HIGH SENSITIVITY)    EKG None  Radiology DG Chest 2 View  Result Date: 01/25/2021 CLINICAL DATA:  Chest pain EXAM: CHEST - 2 VIEW COMPARISON:  11/05/2019 FINDINGS: The heart size and mediastinal contours are within normal limits. Both lungs are clear. The visualized skeletal structures are unremarkable. IMPRESSION: Normal study.  Electronically Signed   By: Charlett Nose M.D.   On: 01/25/2021 18:25    Procedures Procedures   Medications Ordered in ED Medications - No data to display  ED Course  I have reviewed the triage vital signs and the nursing notes.  Pertinent labs & imaging results that were available during my care of the patient were reviewed by me and considered in my medical decision making (see chart for details).    MDM Rules/Calculators/A&P                           Patient presents to the emergency department for evaluation of chest pain.  Has had similar symptoms recur over the past 2 years.  Low suspicion for cardiac etiology given reassuring workup today.  EKG is nonischemic and troponin negative x 2.  Chest x-ray without evidence of mediastinal widening to suggest dissection.  No pneumothorax, pneumonia, pleural effusion.  Pulmonary embolus further considered; however, patient without tachycardia, tachypnea, dyspnea, hypoxia.  Patient is PERC negative.  Unclear etiology of symptoms, but not felt to be emergent.  Will provide referral to cardiology given chronicity of chest pain complaint.  He has been instructed to return for new or concerning symptoms.  Discharged in stable condition with no unaddressed concerns.   Final Clinical Impression(s) / ED Diagnoses Final diagnoses:  Nonspecific chest pain    Rx / DC Orders ED Discharge Orders     None        Antony Madura, PA-C 01/26/21 0205    Sabas Sous, MD 01/26/21 [phone]

## 2021-01-25 NOTE — ED Triage Notes (Signed)
Patient c/o intermittent left chest pain and SOB x 2 days. Patient reports no cardiac history. Patient states he does have a "funny feeling in his left arm"

## 2021-01-25 NOTE — ED Provider Notes (Signed)
Emergency Medicine Provider Triage Evaluation Note  Evan Randall , a 35 y.o. male  was evaluated in triage.  Pt complains of chest pain and shortness of breath.  Patient reports symptoms started about 2 days ago and he has been having intermittent left-sided chest pains that radiate into his left arm with a funny tingling feeling in his left hand.  He reports intermittent shortness of breath with this as well.  He is not sure if anything in particular brings it on, may be worse with activity or movement.  Not worse with deep breath.  Some palpitations.  No cardiac history or history of blood clots  Review of Systems  Positive: Chest pain, shortness of breath Negative: Fever, cough, abdominal pain, vomiting  Physical Exam  BP (!) 141/91 (BP Location: Left Arm)   Pulse 78   Temp 99.2 F (37.3 C) (Oral)   Resp 16   Ht 5\' 8"  (1.727 m)   Wt 89.8 kg   SpO2 100%   BMI 30.11 kg/m  Gen:   Awake, no distress   Resp:  Normal effort, CTA bilaterally Cardiac: RRR MSK:   Moves extremities without difficulty  Other:    Medical Decision Making  Medically screening exam initiated at 6:07 PM.  Appropriate orders placed.  was informed that the remainder of the evaluation will be completed by another provider, this initial triage assessment does not replace that evaluation, and the importance of remaining in the ED until their evaluation is complete.     Milas Hock, PA-C 01/25/21 1809    01/27/21, MD 01/25/21 [phone]

## 2021-01-26 NOTE — Discharge Instructions (Addendum)
Your work-up in the emergency department today has been reassuring.  We recommend follow-up with your primary care doctor.  You have also been given a referral to cardiology given how long you have been experiencing recurrent chest pain.  Call to schedule a follow-up appointment with a cardiologist.  You may return for new or concerning symptoms.

## 2021-07-19 ENCOUNTER — Encounter: Payer: Self-pay | Admitting: *Deleted

## 2021-07-25 ENCOUNTER — Emergency Department (HOSPITAL_BASED_OUTPATIENT_CLINIC_OR_DEPARTMENT_OTHER): Payer: No Typology Code available for payment source | Admitting: Radiology

## 2021-07-25 ENCOUNTER — Other Ambulatory Visit: Payer: Self-pay

## 2021-07-25 ENCOUNTER — Emergency Department (HOSPITAL_BASED_OUTPATIENT_CLINIC_OR_DEPARTMENT_OTHER)
Admission: EM | Admit: 2021-07-25 | Discharge: 2021-07-25 | Disposition: A | Discharge status: Home / Self Care | Payer: No Typology Code available for payment source | Attending: Emergency Medicine | Admitting: Emergency Medicine

## 2021-07-25 ENCOUNTER — Encounter (HOSPITAL_BASED_OUTPATIENT_CLINIC_OR_DEPARTMENT_OTHER): Payer: Self-pay

## 2021-07-25 DIAGNOSIS — R072 Precordial pain: Secondary | ICD-10-CM | POA: Insufficient documentation

## 2021-07-25 DIAGNOSIS — R202 Paresthesia of skin: Secondary | ICD-10-CM | POA: Diagnosis not present

## 2021-07-25 DIAGNOSIS — R079 Chest pain, unspecified: Secondary | ICD-10-CM | POA: Diagnosis present

## 2021-07-25 LAB — URINALYSIS, ROUTINE W REFLEX MICROSCOPIC
Bilirubin Urine: NEGATIVE
Glucose, UA: NEGATIVE mg/dL
Hgb urine dipstick: NEGATIVE
Ketones, ur: NEGATIVE mg/dL
Leukocytes,Ua: NEGATIVE
Nitrite: NEGATIVE
Protein, ur: NEGATIVE mg/dL
Specific Gravity, Urine: <1.005 — ABNORMAL LOW (ref 1.005–1.030)
pH: 6 (ref 5.0–8.0)

## 2021-07-25 LAB — CBC
HCT: 47.5 % (ref 39.0–52.0)
Hemoglobin: 15.6 g/dL (ref 13.0–17.0)
MCH: 27.3 pg (ref 26.0–34.0)
MCHC: 32.8 g/dL (ref 30.0–36.0)
MCV: 83 fL (ref 80.0–100.0)
Platelets: 200 10*3/uL (ref 150–400)
RBC: 5.72 MIL/uL (ref 4.22–5.81)
RDW: 12.8 % (ref 11.5–15.5)
WBC: 6.1 10*3/uL (ref 4.0–10.5)
nRBC: 0 % (ref 0.0–0.2)

## 2021-07-25 LAB — BASIC METABOLIC PANEL
Anion gap: 10 (ref 5–15)
BUN: 14 mg/dL (ref 6–20)
CO2: 26 mmol/L (ref 22–32)
Calcium: 9.8 mg/dL (ref 8.9–10.3)
Chloride: 100 mmol/L (ref 98–111)
Creatinine, Ser: 0.92 mg/dL (ref 0.61–1.24)
GFR, Estimated: >60 mL/min (ref >60)
Glucose, Bld: 85 mg/dL (ref 70–99)
Potassium: 4.1 mmol/L (ref 3.5–5.1)
Sodium: 136 mmol/L (ref 135–145)

## 2021-07-25 LAB — CBG MONITORING, ED: Glucose-Capillary: 74 mg/dL (ref 70–99)

## 2021-07-25 LAB — TROPONIN I (HIGH SENSITIVITY): Troponin I (High Sensitivity): 2 ng/L (ref <18)

## 2021-07-25 NOTE — ED Provider Notes (Signed)
[facility] EMERGENCY DEPT Provider Note   CSN: [id number] Arrival date & time: 07/25/21  1253     History  Chief Complaint  Patient presents with   Chest Pain    Evan Randall is a 36 y.o. male here for evaluation of multiple complaints.  Patient states he has had left-sided chest pain intermittently over the last 3 to 4 years.  Nonexertional nonpleuritic in nature.  Does not radiate to left arm, left back or jaw.  Described as a tight sensation.  States he has been seen for this previously was supposed follow-up with cardiology however he did not.  No lower extremity swelling.  Patient also notes he has had left hand tingling which has been intermittent over the last few years.  States he has to "shake it out" to get his hand to feel "normal."  He denies any facial droop, difficulty with word finding, weakness.  Symptoms are not new or unchanged.  He has not been followed with primary care or neurology for this.  Patient states he has felt "off" since before COVID.  He denies any fever, headache, back pain, abdominal pain, lower extremity swelling.  No meds PTA.  HPI     Home Medications Prior to Admission medications   Not on File      Allergies    Patient has no known allergies.    Review of Systems   Review of Systems  Constitutional: Negative.   HENT: Negative.    Respiratory: Negative.    Cardiovascular:  Positive for chest pain (CP x years). Negative for palpitations and leg swelling.  Gastrointestinal: Negative.   Genitourinary: Negative.   Musculoskeletal: Negative.   Skin: Negative.   Neurological:  Positive for numbness. Negative for dizziness, tremors, seizures, syncope, facial asymmetry, speech difficulty, weakness, light-headedness and headaches.       Left hand tingling x years  All other systems reviewed and are negative.  Physical Exam Updated Vital Signs BP 121/80    Pulse (!) 52    Temp 97.9 F (36.6 C)    Resp 18    Ht 5\' 8"   (1.727 m)    Wt 89.9 kg    SpO2 100%    BMI 30.14 kg/m  Physical Exam Vitals and nursing note reviewed.  Constitutional:      General: He is not in acute distress.    Appearance: He is well-developed. He is not ill-appearing, toxic-appearing or diaphoretic.  HENT:     Head: Normocephalic and atraumatic.  Eyes:     Pupils: Pupils are equal, round, and reactive to light.  Cardiovascular:     Rate and Rhythm: Normal rate and regular rhythm.     Pulses:          Radial pulses are 2+ on the right side and 2+ on the left side.       Dorsalis pedis pulses are 2+ on the right side and 2+ on the left side.     Heart sounds: Normal heart sounds.  Pulmonary:     Effort: Pulmonary effort is normal. No respiratory distress.     Breath sounds: Normal breath sounds.  Chest:     Chest wall: No mass or tenderness.  Abdominal:     General: Bowel sounds are normal. There is no distension.     Palpations: Abdomen is soft. There is no mass.     Tenderness: There is no abdominal tenderness. There is no guarding or rebound.  Musculoskeletal:  General: Normal range of motion.     Cervical back: Normal range of motion and neck supple.     Right lower leg: No tenderness. No edema.     Left lower leg: No tenderness. No edema.  Skin:    General: Skin is warm and dry.     Capillary Refill: Capillary refill takes less than 2 seconds.  Neurological:     General: No focal deficit present.     Mental Status: He is alert and oriented to person, place, and time.    ED Results / Procedures / Treatments   Labs (all labs ordered are listed, but only abnormal results are displayed) Labs Reviewed  URINALYSIS, ROUTINE W REFLEX MICROSCOPIC - Abnormal; Notable for the following components:      Result Value   Color, Urine COLORLESS (*)    Specific Gravity, Urine <1.005 (*)    All other components within normal limits  BASIC METABOLIC PANEL  CBC  CBG MONITORING, ED  TROPONIN I (HIGH SENSITIVITY)     EKG EKG Interpretation  Date/Time:  Monday July 25 2021 13:04:41 EST Ventricular Rate:  67 PR Interval:  146 QRS Duration: 96 QT Interval:  386 QTC Calculation: 407 R Axis:   83 Text Interpretation: Normal sinus rhythm Normal ECG When compared with ECG of 25-Jan-2021 23:15, PREVIOUS ECG IS PRESENT Confirmed by Virgina Norfolk ([id number]) on 07/25/2021 2:21:07 PM  Radiology No results found.  Procedures Procedures    Medications Ordered in ED Medications - No data to display  ED Course/ Medical Decision Making/ A&P    36 year old here for evaluation of multiple complaints.  He is afebrile, nonseptic, not ill-appearing.  Patient with intermittent dull achy chest pain over the last few years.  Symptoms are unchanged.  States he recently became insured and he wanted evaluation.  Symptoms are nonpleuritic and nonexertional in nature.  No associate diaphoresis, nausea, vomiting.  No back pain.  No clinical evidence of VTE on exam.  He is PERC negative, Wells criteria low risk.  Not able to reproduce symptoms on exam.  I reviewed his medical record he has been seen for this previously.  He denies any illicit substance use.  He was post follow-up with cardiology due to length of symptoms however he has not.  Patient also with right hand numbness over the last few years which she has to "shake it out."  He has a nonfocal neuro exam without deficit.  He denies any current symptoms.  No symptoms to suggest acute CVA, MS, cervical neuropathy, dissection  Labs personally viewed and interpreted: UA negative for infection CBC without leukocytosis metabolic panel without electrolyte, renal abnormality Troponin 2 given length of symptoms have low suspicion for acute ACS. EKG without ischemic changes I attempted to get chest x-ray patient however states he recently had this performed at chiropractor office(he did not have adjustment performed) and is declining currently.  I discussed risk of missed  fracture, pneumonia, cardiomegaly, pneumothorax, pulmonary edema.  Patient voiced understanding of risk versus benefit and continues to decline any imaging.  Overall unclear etiology of patient's multiple symptoms.  Do feel he likely needs to follow with primary care provider to get further work-up given symptoms times years as well as possible cardiology follow-up given his recurrent chest pain.  At this time I have low suspicion for acute ACS, PE, dissection.  We will have him follow-up outpatient.  Low suspicion for acute emergent pathology.  The patient has been appropriately medically screened and/or stabilized  in the ED. I have low suspicion for any other emergent medical condition which would require further screening, evaluation or treatment in the ED or require inpatient management.  Patient is hemodynamically stable and in no acute distress.  Patient able to ambulate in department prior to ED.  Evaluation does not show acute pathology that would require ongoing or additional emergent interventions while in the emergency department or further inpatient treatment.  I have discussed the diagnosis with the patient and answered all questions.  Pain is been managed while in the emergency department and patient has no further complaints prior to discharge.  Patient is comfortable with plan discussed in room and is stable for discharge at this time.  I have discussed strict return precautions for returning to the emergency department.  Patient was encouraged to follow-up with PCP/specialist refer to at discharge.                           Medical Decision Making Amount and/or Complexity of Data Reviewed External Data Reviewed: labs, radiology, ECG and notes. Labs: ordered. Decision-making details documented in ED Course. Radiology:     Details: Patient declined imaging ECG/medicine tests: ordered and independent interpretation performed. Decision-making details documented in ED Course.  Risk OTC  drugs. Risk Details: Do not feel patient needs additional labs, imaging, hospitalization at this time  Risk: No PCP          Final Clinical Impression(s) / ED Diagnoses Final diagnoses:  Precordial pain    Rx / DC Orders ED Discharge Orders     None         Chalee Hirota A, PA-C 07/25/21 2036    Virgina Norfolk, DO 07/26/21 [id number]

## 2021-07-25 NOTE — ED Triage Notes (Signed)
Patient here POV from Home with CP.  Patient endorses Left Sided CP since this AM. States this has been occurring for years and is intermittent in nature.  Also endorses Left Hand/Arm Numbness that has also been present for several years.  LSN: "Before COVID". No N/V.   NAD Noted during Triage. A&Ox4. GCS 15. Ambulatory.

## 2021-07-25 NOTE — Discharge Instructions (Signed)
Follow-up with card allergy, return for new or worsening symptoms.

## 2021-11-02 ENCOUNTER — Ambulatory Visit: Payer: No Typology Code available for payment source | Admitting: Emergency Medicine

## 2021-11-02 ENCOUNTER — Encounter: Payer: Self-pay | Admitting: Emergency Medicine

## 2021-11-02 VITALS — BP 116/78 | HR 62 | Temp 98.2°F | Ht 68.0 in | Wt 179.5 lb

## 2021-11-02 DIAGNOSIS — R41840 Attention and concentration deficit: Secondary | ICD-10-CM | POA: Diagnosis not present

## 2021-11-02 DIAGNOSIS — Z1159 Encounter for screening for other viral diseases: Secondary | ICD-10-CM

## 2021-11-02 DIAGNOSIS — Z13220 Encounter for screening for lipoid disorders: Secondary | ICD-10-CM | POA: Diagnosis not present

## 2021-11-02 DIAGNOSIS — G8929 Other chronic pain: Secondary | ICD-10-CM

## 2021-11-02 DIAGNOSIS — Z0001 Encounter for general adult medical examination with abnormal findings: Secondary | ICD-10-CM | POA: Diagnosis not present

## 2021-11-02 DIAGNOSIS — Z13228 Encounter for screening for other metabolic disorders: Secondary | ICD-10-CM

## 2021-11-02 DIAGNOSIS — Z1329 Encounter for screening for other suspected endocrine disorder: Secondary | ICD-10-CM

## 2021-11-02 DIAGNOSIS — Z130 Encounter for screening for diseases of the blood and blood-forming organs and certain disorders involving the immune mechanism: Secondary | ICD-10-CM | POA: Diagnosis not present

## 2021-11-02 DIAGNOSIS — M545 Low back pain, unspecified: Secondary | ICD-10-CM

## 2021-11-02 DIAGNOSIS — Z114 Encounter for screening for human immunodeficiency virus [HIV]: Secondary | ICD-10-CM

## 2021-11-02 DIAGNOSIS — Z7689 Persons encountering health services in other specified circumstances: Secondary | ICD-10-CM

## 2021-11-02 DIAGNOSIS — Z818 Family history of other mental and behavioral disorders: Secondary | ICD-10-CM | POA: Diagnosis not present

## 2021-11-02 LAB — CBC WITH DIFFERENTIAL/PLATELET
Basophils Absolute: 0 10*3/uL (ref 0.0–0.1)
Basophils Relative: 0.6 % (ref 0.0–3.0)
Eosinophils Absolute: 0.1 10*3/uL (ref 0.0–0.7)
Eosinophils Relative: 1.9 % (ref 0.0–5.0)
HCT: 42.9 % (ref 39.0–52.0)
Hemoglobin: 14.2 g/dL (ref 13.0–17.0)
Lymphocytes Relative: 38.2 % (ref 12.0–46.0)
Lymphs Abs: 1.7 10*3/uL (ref 0.7–4.0)
MCHC: 33.1 g/dL (ref 30.0–36.0)
MCV: 84.1 fl (ref 78.0–100.0)
Monocytes Absolute: 0.3 10*3/uL (ref 0.1–1.0)
Monocytes Relative: 6.2 % (ref 3.0–12.0)
Neutro Abs: 2.3 10*3/uL (ref 1.4–7.7)
Neutrophils Relative %: 53.1 % (ref 43.0–77.0)
Platelets: 178 10*3/uL (ref 150.0–400.0)
RBC: 5.1 Mil/uL (ref 4.22–5.81)
RDW: 12.9 % (ref 11.5–15.5)
WBC: 4.4 10*3/uL (ref 4.0–10.5)

## 2021-11-02 LAB — LIPID PANEL
Cholesterol: 206 mg/dL — ABNORMAL HIGH (ref 0–200)
HDL: 64.2 mg/dL (ref >39.00)
LDL Cholesterol: 132 mg/dL — ABNORMAL HIGH (ref 0–99)
NonHDL: 141.62
Total CHOL/HDL Ratio: 3
Triglycerides: 49 mg/dL (ref 0.0–149.0)
VLDL: 9.8 mg/dL (ref 0.0–40.0)

## 2021-11-02 LAB — COMPREHENSIVE METABOLIC PANEL
ALT: 16 U/L (ref 0–53)
AST: 23 U/L (ref 0–37)
Albumin: 4.4 g/dL (ref 3.5–5.2)
Alkaline Phosphatase: 59 U/L (ref 39–117)
BUN: 16 mg/dL (ref 6–23)
CO2: 27 mEq/L (ref 19–32)
Calcium: 9.5 mg/dL (ref 8.4–10.5)
Chloride: 104 mEq/L (ref 96–112)
Creatinine, Ser: 0.97 mg/dL (ref 0.40–1.50)
GFR: 102.33 mL/min (ref >60.00)
Glucose, Bld: 87 mg/dL (ref 70–99)
Potassium: 4.4 mEq/L (ref 3.5–5.1)
Sodium: 138 mEq/L (ref 135–145)
Total Bilirubin: 0.9 mg/dL (ref 0.2–1.2)
Total Protein: 7.5 g/dL (ref 6.0–8.3)

## 2021-11-02 LAB — HEMOGLOBIN A1C: Hgb A1c MFr Bld: 5.4 % (ref 4.6–6.5)

## 2021-11-02 NOTE — Progress Notes (Signed)
Evan Randall 36 y.o.   Chief Complaint  Patient presents with   New Patient (Initial Visit)   Hip Pain   Back Pain   concentration issues    Wants a ADHD eval     referral to Psychologists    HISTORY OF PRESENT ILLNESS: This is a 36 y.o. male first visit to this office, here to establish care with me. Requesting physical. Has history of chronic low back and bilateral hip pain. Has family history of ADHD and thinks he may have it also.  Has problems concentrating and focusing. No chronic medical problems.  No chronic medications. Non-smoker.  Healthy lifestyle. No other complaints or medical concerns today.  Hip Pain   Back Pain Pertinent negatives include no abdominal pain, chest pain, dysuria, fever, headaches or weight loss.    Prior to Admission medications   Not on File    No Known Allergies  There are no problems to display for this patient.   Past Medical History:  Diagnosis Date   High cholesterol     Past Surgical History:  Procedure Laterality Date   URETHRA SURGERY      Social History   Socioeconomic History   Marital status: Single    Spouse name: Not on file   Number of children: Not on file   Years of education: Not on file   Highest education level: Not on file  Occupational History   Not on file  Tobacco Use   Smoking status: Never   Smokeless tobacco: Never  Vaping Use   Vaping Use: Never used  Substance and Sexual Activity   Alcohol use: Yes    Comment: occasional   Drug use: Yes    Frequency: 1.0 times per week    Types: Marijuana   Sexual activity: Not on file  Other Topics Concern   Not on file  Social History Narrative   Not on file   Social Determinants of Health   Financial Resource Strain: Not on file  Food Insecurity: Not on file  Transportation Needs: Not on file  Physical Activity: Not on file  Stress: Not on file  Social Connections: Not on file  Intimate Partner Violence: Not on file    Family  History  Problem Relation Age of Onset   Thyroid disease Mother    Cancer Sister      Review of Systems  Constitutional: Negative.  Negative for chills, fever and weight loss.  HENT: Negative.    Eyes: Negative.   Respiratory: Negative.  Negative for cough and shortness of breath.   Cardiovascular: Negative.  Negative for chest pain and palpitations.  Gastrointestinal: Negative.  Negative for abdominal pain, diarrhea, nausea and vomiting.  Genitourinary: Negative.  Negative for dysuria and hematuria.  Musculoskeletal:  Positive for back pain.  Skin: Negative.  Negative for rash.  Neurological:  Negative for dizziness and headaches.  All other systems reviewed and are negative.  Today's Vitals   11/02/21 0913  BP: 116/78  Pulse: 62  Temp: 98.2 F (36.8 C)  TempSrc: Oral  SpO2: 98%  Weight: 179 lb 8 oz (81.4 kg)  Height: 5\' 8"  (1.727 m)   Body mass index is 27.29 kg/m.  Physical Exam Vitals reviewed.  Constitutional:      Appearance: Normal appearance.  HENT:     Head: Normocephalic.     Right Ear: Tympanic membrane, ear canal and external ear normal.     Left Ear: Tympanic membrane, ear canal and external ear  normal.     Mouth/Throat:     Mouth: Mucous membranes are moist.     Pharynx: Oropharynx is clear.  Eyes:     Extraocular Movements: Extraocular movements intact.     Conjunctiva/sclera: Conjunctivae normal.     Pupils: Pupils are equal, round, and reactive to light.  Cardiovascular:     Rate and Rhythm: Normal rate and regular rhythm.     Pulses: Normal pulses.     Heart sounds: Normal heart sounds.  Pulmonary:     Effort: Pulmonary effort is normal.     Breath sounds: Normal breath sounds.  Abdominal:     General: Bowel sounds are normal. There is no distension.     Palpations: Abdomen is soft.     Tenderness: There is no abdominal tenderness.  Musculoskeletal:        General: Normal range of motion.     Cervical back: No tenderness.     Right  lower leg: No edema.     Left lower leg: No edema.  Lymphadenopathy:     Cervical: No cervical adenopathy.  Skin:    General: Skin is warm and dry.     Capillary Refill: Capillary refill takes less than 2 seconds.  Neurological:     General: No focal deficit present.     Mental Status: He is alert and oriented to person, place, and time.  Psychiatric:        Mood and Affect: Mood normal.        Behavior: Behavior normal.     ASSESSMENT & PLAN: Problem List Items Addressed This Visit   None Visit Diagnoses     Encounter for general adult medical examination with abnormal findings    -  Primary   Chronic bilateral low back pain without sciatica       Relevant Orders   Ambulatory referral to Sports Medicine   Family history of attention deficit hyperactivity disorder (ADHD)       Concentration deficit       Relevant Orders   Ambulatory referral to Psychiatry   Attention deficit       Relevant Orders   Ambulatory referral to Psychiatry   Encounter to establish care       Need for hepatitis C screening test       Relevant Orders   Hepatitis C antibody screen   Screening for HIV (human immunodeficiency virus)       Relevant Orders   HIV antibody   Screening for deficiency anemia       Relevant Orders   CBC with Differential   Screening for lipoid disorders       Relevant Orders   Lipid panel   Screening for endocrine, metabolic and immunity disorder       Relevant Orders   Comprehensive metabolic panel   Hemoglobin A1c      Modifiable risk factors discussed with patient. Anticipatory guidance according to age provided. The following topics were also discussed: Social Determinants of Health Smoking.  Non-smoker Diet and nutrition.  Good eating habits Benefits of exercise.  Exercises regularly Cancer family history review Vaccinations recommendations Cardiovascular risk assessment and need for blood work Possible ADHD diagnosis and need for behavioral health  assessment Mental health including depression and anxiety Fall and accident prevention.  Chronic lumbar and hip pain.  Will benefit from sports medicine evaluation.  Referral placed today.  Patient Instructions  Health Maintenance, Male Adopting a healthy lifestyle and getting preventive care are important  in promoting health and wellness. Ask your health care provider about: The right schedule for you to have regular tests and exams. Things you can do on your own to prevent diseases and keep yourself healthy. What should I know about diet, weight, and exercise? Eat a healthy diet  Eat a diet that includes plenty of vegetables, fruits, low-fat dairy products, and lean protein. Do not eat a lot of foods that are high in solid fats, added sugars, or sodium. Maintain a healthy weight Body mass index (BMI) is a measurement that can be used to identify possible weight problems. It estimates body fat based on height and weight. Your health care provider can help determine your BMI and help you achieve or maintain a healthy weight. Get regular exercise Get regular exercise. This is one of the most important things you can do for your health. Most adults should: Exercise for at least 150 minutes each week. The exercise should increase your heart rate and make you sweat (moderate-intensity exercise). Do strengthening exercises at least twice a week. This is in addition to the moderate-intensity exercise. Spend less time sitting. Even light physical activity can be beneficial. Watch cholesterol and blood lipids Have your blood tested for lipids and cholesterol at 34 years of age, then have this test every 5 years. You may need to have your cholesterol levels checked more often if: Your lipid or cholesterol levels are high. You are older than 34 years of age. You are at high risk for heart disease. What should I know about cancer screening? Many types of cancers can be detected early and may often  be prevented. Depending on your health history and family history, you may need to have cancer screening at various ages. This may include screening for: Colorectal cancer. Prostate cancer. Skin cancer. Lung cancer. What should I know about heart disease, diabetes, and high blood pressure? Blood pressure and heart disease High blood pressure causes heart disease and increases the risk of stroke. This is more likely to develop in people who have high blood pressure readings or are overweight. Talk with your health care provider about your target blood pressure readings. Have your blood pressure checked: Every 3-5 years if you are 1-38 years of age. Every year if you are 51 years old or older. If you are between the ages of 19 and 90 and are a current or former smoker, ask your health care provider if you should have a one-time screening for abdominal aortic aneurysm (AAA). Diabetes Have regular diabetes screenings. This checks your fasting blood sugar level. Have the screening done: Once every three years after age 70 if you are at a normal weight and have a low risk for diabetes. More often and at a younger age if you are overweight or have a high risk for diabetes. What should I know about preventing infection? Hepatitis B If you have a higher risk for hepatitis B, you should be screened for this virus. Talk with your health care provider to find out if you are at risk for hepatitis B infection. Hepatitis C Blood testing is recommended for: Everyone born from [birth year] through 1963. Anyone with known risk factors for hepatitis C. Sexually transmitted infections (STIs) You should be screened each year for STIs, including gonorrhea and chlamydia, if: You are sexually active and are younger than 34 years of age. You are older than 34 years of age and your health care provider tells you that you are at risk for this type  of infection. Your sexual activity has changed since you were last  screened, and you are at increased risk for chlamydia or gonorrhea. Ask your health care provider if you are at risk. Ask your health care provider about whether you are at high risk for HIV. Your health care provider may recommend a prescription medicine to help prevent HIV infection. If you choose to take medicine to prevent HIV, you should first get tested for HIV. You should then be tested every 3 months for as long as you are taking the medicine. Follow these instructions at home: Alcohol use Do not drink alcohol if your health care provider tells you not to drink. If you drink alcohol: Limit how much you have to 0-2 drinks a day. Know how much alcohol is in your drink. In the [place] one drink equals one 12 oz bottle of beer (355 mL), one 5 oz glass of wine (148 mL), or one 1 oz glass of hard liquor (44 mL). Lifestyle Do not use any products that contain nicotine or tobacco. These products include cigarettes, chewing tobacco, and vaping devices, such as e-cigarettes. If you need help quitting, ask your health care provider. Do not use street drugs. Do not share needles. Ask your health care provider for help if you need support or information about quitting drugs. General instructions Schedule regular health, dental, and eye exams. Stay current with your vaccines. Tell your health care provider if: You often feel depressed. You have ever been abused or do not feel safe at home. Summary Adopting a healthy lifestyle and getting preventive care are important in promoting health and wellness. Follow your health care provider's instructions about healthy diet, exercising, and getting tested or screened for diseases. Follow your health care provider's instructions on monitoring your cholesterol and blood pressure. This information is not intended to replace advice given to you by your health care provider. Make sure you discuss any questions you have with your health care provider. Document  Revised: 10/11/2020 Document Reviewed: 10/11/2020 Elsevier Patient Education  2023 [employer]     Edwina Barth, MD [facility]

## 2021-11-02 NOTE — Patient Instructions (Signed)
Health Maintenance, Male Adopting a healthy lifestyle and getting preventive care are important in promoting health and wellness. Ask your health care provider about: The right schedule for you to have regular tests and exams. Things you can do on your own to prevent diseases and keep yourself healthy. What should I know about diet, weight, and exercise? Eat a healthy diet  Eat a diet that includes plenty of vegetables, fruits, low-fat dairy products, and lean protein. Do not eat a lot of foods that are high in solid fats, added sugars, or sodium. Maintain a healthy weight Body mass index (BMI) is a measurement that can be used to identify possible weight problems. It estimates body fat based on height and weight. Your health care provider can help determine your BMI and help you achieve or maintain a healthy weight. Get regular exercise Get regular exercise. This is one of the most important things you can do for your health. Most adults should: Exercise for at least 150 minutes each week. The exercise should increase your heart rate and make you sweat (moderate-intensity exercise). Do strengthening exercises at least twice a week. This is in addition to the moderate-intensity exercise. Spend less time sitting. Even light physical activity can be beneficial. Watch cholesterol and blood lipids Have your blood tested for lipids and cholesterol at 34 years of age, then have this test every 5 years. You may need to have your cholesterol levels checked more often if: Your lipid or cholesterol levels are high. You are older than 34 years of age. You are at high risk for heart disease. What should I know about cancer screening? Many types of cancers can be detected early and may often be prevented. Depending on your health history and family history, you may need to have cancer screening at various ages. This may include screening for: Colorectal cancer. Prostate cancer. Skin cancer. Lung  cancer. What should I know about heart disease, diabetes, and high blood pressure? Blood pressure and heart disease High blood pressure causes heart disease and increases the risk of stroke. This is more likely to develop in people who have high blood pressure readings or are overweight. Talk with your health care provider about your target blood pressure readings. Have your blood pressure checked: Every 3-5 years if you are 18-39 years of age. Every year if you are 40 years old or older. If you are between the ages of 65 and 75 and are a current or former smoker, ask your health care provider if you should have a one-time screening for abdominal aortic aneurysm (AAA). Diabetes Have regular diabetes screenings. This checks your fasting blood sugar level. Have the screening done: Once every three years after age 45 if you are at a normal weight and have a low risk for diabetes. More often and at a younger age if you are overweight or have a high risk for diabetes. What should I know about preventing infection? Hepatitis B If you have a higher risk for hepatitis B, you should be screened for this virus. Talk with your health care provider to find out if you are at risk for hepatitis B infection. Hepatitis C Blood testing is recommended for: Everyone born from 1945 through 1965. Anyone with known risk factors for hepatitis C. Sexually transmitted infections (STIs) You should be screened each year for STIs, including gonorrhea and chlamydia, if: You are sexually active and are younger than 34 years of age. You are older than 34 years of age and your   health care provider tells you that you are at risk for this type of infection. Your sexual activity has changed since you were last screened, and you are at increased risk for chlamydia or gonorrhea. Ask your health care provider if you are at risk. Ask your health care provider about whether you are at high risk for HIV. Your health care provider  may recommend a prescription medicine to help prevent HIV infection. If you choose to take medicine to prevent HIV, you should first get tested for HIV. You should then be tested every 3 months for as long as you are taking the medicine. Follow these instructions at home: Alcohol use Do not drink alcohol if your health care provider tells you not to drink. If you drink alcohol: Limit how much you have to 0-2 drinks a day. Know how much alcohol is in your drink. In the [place] one drink equals one 12 oz bottle of beer (355 mL), one 5 oz glass of wine (148 mL), or one 1 oz glass of hard liquor (44 mL). Lifestyle Do not use any products that contain nicotine or tobacco. These products include cigarettes, chewing tobacco, and vaping devices, such as e-cigarettes. If you need help quitting, ask your health care provider. Do not use street drugs. Do not share needles. Ask your health care provider for help if you need support or information about quitting drugs. General instructions Schedule regular health, dental, and eye exams. Stay current with your vaccines. Tell your health care provider if: You often feel depressed. You have ever been abused or do not feel safe at home. Summary Adopting a healthy lifestyle and getting preventive care are important in promoting health and wellness. Follow your health care provider's instructions about healthy diet, exercising, and getting tested or screened for diseases. Follow your health care provider's instructions on monitoring your cholesterol and blood pressure. This information is not intended to replace advice given to you by your health care provider. Make sure you discuss any questions you have with your health care provider. Document Revised: 10/11/2020 Document Reviewed: 10/11/2020 Elsevier Patient Education  2023 [employer] Inc.  

## 2021-11-03 LAB — HEPATITIS C ANTIBODY
Hepatitis C Ab: NONREACTIVE
SIGNAL TO CUT-OFF: 0.06

## 2021-11-03 LAB — HIV ANTIBODY (ROUTINE TESTING W REFLEX): HIV 1&2 Ab, 4th Generation: NONREACTIVE

## 2021-11-08 ENCOUNTER — Encounter: Payer: Self-pay | Admitting: Emergency Medicine

## 2021-11-08 NOTE — Telephone Encounter (Signed)
Refer to endocrinologist for this advanced type of testing requested. Unlikely it will be covered by his insurance and not the type of testing that primary care physicians do.

## 2021-11-09 ENCOUNTER — Ambulatory Visit: Payer: No Typology Code available for payment source | Admitting: Sports Medicine

## 2021-11-10 ENCOUNTER — Other Ambulatory Visit: Payer: Self-pay | Admitting: Emergency Medicine

## 2021-11-10 DIAGNOSIS — Z711 Person with feared health complaint in whom no diagnosis is made: Secondary | ICD-10-CM

## 2021-11-10 NOTE — Telephone Encounter (Signed)
Endocrinology referral placed today.

## 2021-11-22 NOTE — Progress Notes (Unsigned)
    [facility] [address] [place] [address] Phone: [phone]   Assessment and Plan:     There are no diagnoses linked to this encounter.  ***   Pertinent previous records reviewed include ***   Follow Up: ***     Subjective:   I, Evan Randall, am serving as a Neurosurgeon for Evan Randall  Chief Complaint: hip and back pain   HPI:   11/23/2021 Patient is a 36 year old male complaining of hip and back pain. Patient states  Relevant Historical Information: ***  Additional pertinent review of systems negative.  No current outpatient medications on file.   Objective:     There were no vitals filed for this visit.    There is no height or weight on file to calculate BMI.    Physical Exam:    ***   Electronically signed by:  [facility] 7:49 AM 11/22/21

## 2021-11-23 ENCOUNTER — Ambulatory Visit (INDEPENDENT_AMBULATORY_CARE_PROVIDER_SITE_OTHER): Payer: No Typology Code available for payment source | Admitting: Sports Medicine

## 2021-11-23 VITALS — Ht 68.0 in | Wt 175.0 lb

## 2021-11-23 DIAGNOSIS — M6289 Other specified disorders of muscle: Secondary | ICD-10-CM | POA: Diagnosis not present

## 2021-11-23 DIAGNOSIS — M24551 Contracture, right hip: Secondary | ICD-10-CM | POA: Diagnosis not present

## 2021-11-23 DIAGNOSIS — M25811 Other specified joint disorders, right shoulder: Secondary | ICD-10-CM

## 2021-11-23 DIAGNOSIS — M24552 Contracture, left hip: Secondary | ICD-10-CM

## 2021-11-23 NOTE — Patient Instructions (Addendum)
Good to see  you  Recommend getting a personal trainer As needed follow up

## 2021-11-24 ENCOUNTER — Telehealth: Payer: Self-pay | Admitting: Emergency Medicine

## 2021-11-24 NOTE — Telephone Encounter (Signed)
Pt is requesting a callback from a cma to go over his lab results with him. He has some questions about the results.   Please advise.

## 2021-11-25 ENCOUNTER — Other Ambulatory Visit: Payer: Self-pay | Admitting: Emergency Medicine

## 2021-11-25 ENCOUNTER — Encounter: Payer: Self-pay | Admitting: *Deleted

## 2021-11-25 DIAGNOSIS — Z113 Encounter for screening for infections with a predominantly sexual mode of transmission: Secondary | ICD-10-CM

## 2021-11-25 DIAGNOSIS — E7800 Pure hypercholesterolemia, unspecified: Secondary | ICD-10-CM

## 2022-01-19 ENCOUNTER — Ambulatory Visit (HOSPITAL_COMMUNITY): Payer: Self-pay | Admitting: Psychiatry

## 2022-02-13 ENCOUNTER — Ambulatory Visit: Payer: No Typology Code available for payment source | Admitting: Emergency Medicine

## 2022-03-04 ENCOUNTER — Ambulatory Visit
Admission: EM | Admit: 2022-03-04 | Discharge: 2022-03-04 | Disposition: A | Discharge status: Home / Self Care | Payer: No Typology Code available for payment source | Attending: Urgent Care | Admitting: Urgent Care

## 2022-03-04 DIAGNOSIS — T22211A Burn of second degree of right forearm, initial encounter: Secondary | ICD-10-CM | POA: Diagnosis not present

## 2022-03-04 DIAGNOSIS — M79631 Pain in right forearm: Secondary | ICD-10-CM | POA: Diagnosis not present

## 2022-03-04 MED ORDER — SILVER SULFADIAZINE 1 % EX CREA: 1.0000 | TOPICAL_CREAM | Freq: Two times a day (BID) | CUTANEOUS | 0 refills | Status: DC
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
## 2022-03-04 NOTE — Discharge Instructions (Signed)
Please change your dressing 2-3 times daily. Apply silvadene cream generously and secure with a non-stick dressing. Each time you change your dressing, make sure you clean gently around the perimeter of the wound with gentle soap and warm water. Do not peel off any dead skin. If it comes off in the process of washing your wound or removing the dressing, that's okay. Pat your wound dry and let it air out if possible for an hour before reapplying another dressing.   Use ibuprofen for pain relief.

## 2022-03-12 ENCOUNTER — Ambulatory Visit
Admission: EM | Admit: 2022-03-12 | Discharge: 2022-03-12 | Disposition: A | Discharge status: Home / Self Care | Payer: No Typology Code available for payment source | Attending: Emergency Medicine | Admitting: Emergency Medicine

## 2022-03-12 DIAGNOSIS — K146 Glossodynia: Secondary | ICD-10-CM

## 2022-03-12 DIAGNOSIS — K148 Other diseases of tongue: Secondary | ICD-10-CM

## 2022-03-12 MED ORDER — CHLORHEXIDINE GLUCONATE 0.12 % MT SOLN: 15.0000 mL | Freq: Two times a day (BID) | OROMUCOSAL | 0 refills | Status: DC

## 2022-03-12 NOTE — ED Provider Notes (Signed)
[facility]    CSN: [id number] Arrival date & time: 03/12/22  1143    HISTORY   Chief Complaint  Patient presents with   sore tongue   HPI Evan Randall is a pleasant, 36 y.o. male who presents to urgent care today. Patient reports "discomfort" of his tongue, states it does not hurt or burn, just feels "different" when he eats certain foods.  Patient states he is also noticed that he has a white coating on his tongue and is here to to have the coating "tested" so he can find out what it is.  Patient states he has not tried any interventions to alleviate these concerns.  The history is provided by the patient.   Past Medical History:  Diagnosis Date   High cholesterol    There are no problems to display for this patient.  Past Surgical History:  Procedure Laterality Date   URETHRA SURGERY      Home Medications    Prior to Admission medications   Medication Sig Start Date End Date Taking? Authorizing Provider  chlorhexidine (PERIDEX) 0.12 % solution Use as directed 15 mLs in the mouth or throat 2 (two) times daily. 03/12/22  Yes Lynden Oxford Scales, PA-C  silver sulfADIAZINE (SILVADENE) 1 % cream Apply 1 Application topically 2 (two) times daily. 03/04/22   Jaynee Eagles, PA-C    Family History Family History  Problem Relation Age of Onset   Thyroid disease Mother    Cancer Sister    Social History Social History   Tobacco Use   Smoking status: Never   Smokeless tobacco: Never  Vaping Use   Vaping Use: Never used  Substance Use Topics   Alcohol use: Yes    Comment: occasional   Drug use: Yes    Frequency: 1.0 times per week    Types: Marijuana   Allergies   Patient has no known allergies.  Review of Systems Review of Systems Pertinent findings revealed after performing a 14 point review of systems has been noted in the history of present illness.  Physical Exam Triage Vital Signs ED Triage Vitals  Enc Vitals Group     BP 04/01/21 0827  (!) 147/82     Pulse Rate 04/01/21 0827 72     Resp 04/01/21 0827 18     Temp 04/01/21 0827 98.3 F (36.8 C)     Temp Source 04/01/21 0827 Oral     SpO2 04/01/21 0827 98 %     Weight --      Height --      Head Circumference --      Peak Flow --      Pain Score 04/01/21 0826 5     Pain Loc --      Pain Edu? --      Excl. in [place]? --   No data found.  Updated Vital Signs BP 112/72 (BP Location: Right Arm)   Pulse 60   Temp 97.9 F (36.6 C) (Oral)   Resp 16   SpO2 97%   Physical Exam Vitals and nursing note reviewed.  Constitutional:      General: He is not in acute distress.    Appearance: Normal appearance. He is not ill-appearing.  HENT:     Head: Normocephalic and atraumatic.     Salivary Glands: Right salivary gland is not diffusely enlarged or tender. Left salivary gland is not diffusely enlarged or tender.     Right Ear: Tympanic membrane, ear canal and  external ear normal. No drainage. No middle ear effusion. There is no impacted cerumen. Tympanic membrane is not erythematous or bulging.     Left Ear: Tympanic membrane, ear canal and external ear normal. No drainage.  No middle ear effusion. There is no impacted cerumen. Tympanic membrane is not erythematous or bulging.     Nose: Nose normal. No nasal deformity, septal deviation, mucosal edema, congestion or rhinorrhea.     Right Turbinates: Not enlarged, swollen or pale.     Left Turbinates: Not enlarged, swollen or pale.     Right Sinus: No maxillary sinus tenderness or frontal sinus tenderness.     Left Sinus: No maxillary sinus tenderness or frontal sinus tenderness.     Mouth/Throat:     Lips: Pink. No lesions.     Mouth: Mucous membranes are moist. No oral lesions.     Tongue: No lesions. Tongue does not deviate from midline.     Palate: No mass and lesions.     Pharynx: Oropharynx is clear. Uvula midline. No posterior oropharyngeal erythema or uvula swelling.     Tonsils: No tonsillar exudate. 0 on the right.  0 on the left.  Eyes:     General: Lids are normal.        Right eye: No discharge.        Left eye: No discharge.     Extraocular Movements: Extraocular movements intact.     Conjunctiva/sclera: Conjunctivae normal.     Right eye: Right conjunctiva is not injected.     Left eye: Left conjunctiva is not injected.  Neck:     Trachea: Trachea and phonation normal.  Cardiovascular:     Rate and Rhythm: Normal rate and regular rhythm.     Pulses: Normal pulses.     Heart sounds: Normal heart sounds. No murmur heard.    No friction rub. No gallop.  Pulmonary:     Effort: Pulmonary effort is normal. No accessory muscle usage, prolonged expiration or respiratory distress.     Breath sounds: Normal breath sounds. No stridor, decreased air movement or transmitted upper airway sounds. No decreased breath sounds, wheezing, rhonchi or rales.  Chest:     Chest wall: No tenderness.  Musculoskeletal:        General: Normal range of motion.     Cervical back: Normal range of motion and neck supple. Normal range of motion.  Lymphadenopathy:     Cervical: No cervical adenopathy.  Skin:    General: Skin is warm and dry.     Findings: No erythema or rash.  Neurological:     General: No focal deficit present.     Mental Status: He is alert and oriented to person, place, and time.  Psychiatric:        Mood and Affect: Mood normal.        Behavior: Behavior normal.     Visual Acuity Right Eye Distance:   Left Eye Distance:   Bilateral Distance:    Right Eye Near:   Left Eye Near:    Bilateral Near:     UC Couse / Diagnostics / Procedures:     Radiology No results found.  Procedures Procedures (including critical care time) EKG  Pending results:  Labs Reviewed - No data to display  Medications Ordered in UC: Medications - No data to display  UC Diagnoses / Final Clinical Impressions(s)   I have reviewed the triage vital signs and the nursing notes.  Pertinent labs & imaging  results that were available during my care of the patient were reviewed by me and considered in my medical decision making (see chart for details).    Final diagnoses:  Abnormal color of tongue  Glossalgia   Patient advised that I do not have any means to perform any testing of the white coating on his tongue.  Patient advised to brush his tongue when brushing his teeth.  Patient also provided with chlorhexidine mouthwash as he is requesting some form of treatment.  Patient advised that I do not believe it is likely that this is bacterial but he is certainly welcome to try Peridex.  Patient advised to follow-up with PCP for this non urgent issue.  ED Prescriptions     Medication Sig Dispense Auth. Provider   chlorhexidine (PERIDEX) 0.12 % solution Use as directed 15 mLs in the mouth or throat 2 (two) times daily. 120 mL Lynden Oxford Scales, PA-C      PDMP not reviewed this encounter.  Pending results:  Labs Reviewed - No data to display  Discharge Instructions:   Discharge Instructions      I have included some information about glossitis that I hope you find helpful.  I sent prescription for chlorhexidine mouthwash to your pharmacy.  Please swish around that 15 mL twice daily prior to brushing your teeth.    Disposition Upon Discharge:  Condition: stable for discharge home  Patient presented with an acute illness with associated systemic symptoms and significant discomfort requiring urgent management. In my opinion, this is a condition that a prudent lay person (someone who possesses an average knowledge of health and medicine) may potentially expect to result in complications if not addressed urgently such as respiratory distress, impairment of bodily function or dysfunction of bodily organs.   Routine symptom specific, illness specific and/or disease specific instructions were discussed with the patient and/or caregiver at length.   As such, the patient has been evaluated  and assessed, work-up was performed and treatment was provided in alignment with urgent care protocols and evidence based medicine.  Patient/parent/caregiver has been advised that the patient may require follow up for further testing and treatment if the symptoms continue in spite of treatment, as clinically indicated and appropriate.  Patient/parent/caregiver has been advised to return to the [facility] or PCP if no better; to PCP or the Emergency Department if new signs and symptoms develop, or if the current signs or symptoms continue to change or worsen for further workup, evaluation and treatment as clinically indicated and appropriate  The patient will follow up with their current PCP if and as advised. If the patient does not currently have a PCP we will assist them in obtaining one.   The patient may need specialty follow up if the symptoms continue, in spite of conservative treatment and management, for further workup, evaluation, consultation and treatment as clinically indicated and appropriate.   Patient/parent/caregiver verbalized understanding and agreement of plan as discussed.  All questions were addressed during visit.  Please see discharge instructions below for further details of plan.  This office note has been dictated using Museum/gallery curator.  Unfortunately, this method of dictation can sometimes lead to typographical or grammatical errors.  I apologize for your inconvenience in advance if this occurs.  Please do not hesitate to reach out to me if clarification is needed.      Lynden Oxford Scales, PA-C 03/13/22 1242

## 2022-03-12 NOTE — Discharge Instructions (Signed)
I have included some information about glossitis that I hope you find helpful.  I sent prescription for chlorhexidine mouthwash to your pharmacy.  Please swish around that 15 mL twice daily prior to brushing your teeth.

## 2022-03-12 NOTE — ED Triage Notes (Signed)
The patient states his tongue has been aching and has a white coat on the top.   Started: 1.5 weeks ago   Home interventions: peroxide rinse

## 2022-06-14 ENCOUNTER — Ambulatory Visit
Admission: EM | Admit: 2022-06-14 | Discharge: 2022-06-14 | Disposition: A | Discharge status: Home / Self Care | Payer: No Typology Code available for payment source | Attending: Urgent Care | Admitting: Urgent Care

## 2022-06-14 DIAGNOSIS — H1033 Unspecified acute conjunctivitis, bilateral: Secondary | ICD-10-CM

## 2022-06-14 MED ORDER — TOBRAMYCIN 0.3 % OP SOLN: 1.0000 [drp] | OPHTHALMIC | 0 refills | Status: DC

## 2022-06-14 NOTE — ED Provider Notes (Signed)
[facility]  Note:  This document was prepared using Systems analyst and may include unintentional dictation errors.  MRN: [id number] DOB: 07-Mar-1986  Subjective:   Evan Randall is a 37 y.o. male presenting for 2-day history of acute onset redness and irritation of both eyes.  Patient feels like the right eye is worse than the left and thinks it started there.  No contact lens use, eye trauma, photophobia, vision changes.  He has kids at home but they do not have any symptoms.  No current facility-administered medications for this encounter.  Current Outpatient Medications:    chlorhexidine (PERIDEX) 0.12 % solution, Use as directed 15 mLs in the mouth or throat 2 (two) times daily., Disp: 120 mL, Rfl: 0   silver sulfADIAZINE (SILVADENE) 1 % cream, Apply 1 Application topically 2 (two) times daily., Disp: 200 g, Rfl: 0   No Known Allergies  Past Medical History:  Diagnosis Date   High cholesterol      Past Surgical History:  Procedure Laterality Date   URETHRA SURGERY      Family History  Problem Relation Age of Onset   Thyroid disease Mother    Cancer Sister     Social History   Tobacco Use   Smoking status: Never   Smokeless tobacco: Never  Vaping Use   Vaping Use: Never used  Substance Use Topics   Alcohol use: Yes    Comment: occasional   Drug use: Yes    Frequency: 1.0 times per week    Types: Marijuana    ROS   Objective:   Vitals: BP 115/74 (BP Location: Right Arm)   Pulse 60   Temp 98.9 F (37.2 C) (Oral)   Resp 16   SpO2 95%   Physical Exam Constitutional:      General: He is not in acute distress.    Appearance: Normal appearance. He is well-developed and normal weight. He is not ill-appearing, toxic-appearing or diaphoretic.  HENT:     Head: Normocephalic and atraumatic.     Right Ear: External ear normal.     Left Ear: External ear normal.     Nose: Nose normal.     Mouth/Throat:      Pharynx: Oropharynx is clear.  Eyes:     General: Lids are everted, no foreign bodies appreciated. No scleral icterus.       Right eye: No foreign body, discharge or hordeolum.        Left eye: No foreign body, discharge or hordeolum.     Extraocular Movements: Extraocular movements intact.     Conjunctiva/sclera:     Right eye: Right conjunctiva is injected. No chemosis, exudate or hemorrhage.    Left eye: Left conjunctiva is injected. No chemosis, exudate or hemorrhage. Cardiovascular:     Rate and Rhythm: Normal rate.  Pulmonary:     Effort: Pulmonary effort is normal.  Musculoskeletal:     Cervical back: Normal range of motion.  Neurological:     Mental Status: He is alert and oriented to person, place, and time.  Psychiatric:        Mood and Affect: Mood normal.        Behavior: Behavior normal.        Thought Content: Thought content normal.        Judgment: Judgment normal.     Assessment and Plan :   PDMP not reviewed this encounter.  1. Acute bacterial conjunctivitis of both eyes  Low suspicion for an acute ophthalmologic emergency.  Will start tobramycin to address bacterial conjunctivitis of of both eyes. Counseled patient on potential for adverse effects with medications prescribed/recommended today, ER and return-to-clinic precautions discussed, patient verbalized understanding.    Jaynee Eagles, PA-C 06/14/22 1019

## 2022-06-14 NOTE — ED Triage Notes (Signed)
Pt c/o redness to both eyes x 2 days-right worse than left-NA-steady gait

## 2022-06-22 ENCOUNTER — Ambulatory Visit (INDEPENDENT_AMBULATORY_CARE_PROVIDER_SITE_OTHER): Payer: Managed Care, Other (non HMO) | Admitting: Emergency Medicine

## 2022-06-22 ENCOUNTER — Encounter: Payer: Self-pay | Admitting: Emergency Medicine

## 2022-06-22 VITALS — BP 118/68 | HR 65 | Temp 97.9°F | Ht 68.5 in | Wt 174.0 lb

## 2022-06-22 DIAGNOSIS — Z130 Encounter for screening for diseases of the blood and blood-forming organs and certain disorders involving the immune mechanism: Secondary | ICD-10-CM | POA: Diagnosis not present

## 2022-06-22 DIAGNOSIS — Z0000 Encounter for general adult medical examination without abnormal findings: Secondary | ICD-10-CM

## 2022-06-22 DIAGNOSIS — Z13228 Encounter for screening for other metabolic disorders: Secondary | ICD-10-CM

## 2022-06-22 DIAGNOSIS — Z13220 Encounter for screening for lipoid disorders: Secondary | ICD-10-CM

## 2022-06-22 DIAGNOSIS — Z1329 Encounter for screening for other suspected endocrine disorder: Secondary | ICD-10-CM | POA: Diagnosis not present

## 2022-06-22 NOTE — Patient Instructions (Signed)
Health Maintenance, Male Adopting a healthy lifestyle and getting preventive care are important in promoting health and wellness. Ask your health care provider about: The right schedule for you to have regular tests and exams. Things you can do on your own to prevent diseases and keep yourself healthy. What should I know about diet, weight, and exercise? Eat a healthy diet  Eat a diet that includes plenty of vegetables, fruits, low-fat dairy products, and lean protein. Do not eat a lot of foods that are high in solid fats, added sugars, or sodium. Maintain a healthy weight Body mass index (BMI) is a measurement that can be used to identify possible weight problems. It estimates body fat based on height and weight. Your health care provider can help determine your BMI and help you achieve or maintain a healthy weight. Get regular exercise Get regular exercise. This is one of the most important things you can do for your health. Most adults should: Exercise for at least 150 minutes each week. The exercise should increase your heart rate and make you sweat (moderate-intensity exercise). Do strengthening exercises at least twice a week. This is in addition to the moderate-intensity exercise. Spend less time sitting. Even light physical activity can be beneficial. Watch cholesterol and blood lipids Have your blood tested for lipids and cholesterol at 35 years of age, then have this test every 5 years. You may need to have your cholesterol levels checked more often if: Your lipid or cholesterol levels are high. You are older than 35 years of age. You are at high risk for heart disease. What should I know about cancer screening? Many types of cancers can be detected early and may often be prevented. Depending on your health history and family history, you may need to have cancer screening at various ages. This may include screening for: Colorectal cancer. Prostate cancer. Skin cancer. Lung  cancer. What should I know about heart disease, diabetes, and high blood pressure? Blood pressure and heart disease High blood pressure causes heart disease and increases the risk of stroke. This is more likely to develop in people who have high blood pressure readings or are overweight. Talk with your health care provider about your target blood pressure readings. Have your blood pressure checked: Every 3-5 years if you are 18-39 years of age. Every year if you are 40 years old or older. If you are between the ages of 65 and 75 and are a current or former smoker, ask your health care provider if you should have a one-time screening for abdominal aortic aneurysm (AAA). Diabetes Have regular diabetes screenings. This checks your fasting blood sugar level. Have the screening done: Once every three years after age 45 if you are at a normal weight and have a low risk for diabetes. More often and at a younger age if you are overweight or have a high risk for diabetes. What should I know about preventing infection? Hepatitis B If you have a higher risk for hepatitis B, you should be screened for this virus. Talk with your health care provider to find out if you are at risk for hepatitis B infection. Hepatitis C Blood testing is recommended for: Everyone born from 1945 through 1965. Anyone with known risk factors for hepatitis C. Sexually transmitted infections (STIs) You should be screened each year for STIs, including gonorrhea and chlamydia, if: You are sexually active and are younger than 35 years of age. You are older than 35 years of age and your   health care provider tells you that you are at risk for this type of infection. Your sexual activity has changed since you were last screened, and you are at increased risk for chlamydia or gonorrhea. Ask your health care provider if you are at risk. Ask your health care provider about whether you are at high risk for HIV. Your health care provider  may recommend a prescription medicine to help prevent HIV infection. If you choose to take medicine to prevent HIV, you should first get tested for HIV. You should then be tested every 3 months for as long as you are taking the medicine. Follow these instructions at home: Alcohol use Do not drink alcohol if your health care provider tells you not to drink. If you drink alcohol: Limit how much you have to 0-2 drinks a day. Know how much alcohol is in your drink. In the [place] one drink equals one 12 oz bottle of beer (355 mL), one 5 oz glass of wine (148 mL), or one 1 oz glass of hard liquor (44 mL). Lifestyle Do not use any products that contain nicotine or tobacco. These products include cigarettes, chewing tobacco, and vaping devices, such as e-cigarettes. If you need help quitting, ask your health care provider. Do not use street drugs. Do not share needles. Ask your health care provider for help if you need support or information about quitting drugs. General instructions Schedule regular health, dental, and eye exams. Stay current with your vaccines. Tell your health care provider if: You often feel depressed. You have ever been abused or do not feel safe at home. Summary Adopting a healthy lifestyle and getting preventive care are important in promoting health and wellness. Follow your health care provider's instructions about healthy diet, exercising, and getting tested or screened for diseases. Follow your health care provider's instructions on monitoring your cholesterol and blood pressure. This information is not intended to replace advice given to you by your health care provider. Make sure you discuss any questions you have with your health care provider. Document Revised: 10/11/2020 Document Reviewed: 10/11/2020 Elsevier Patient Education  2023 Elsevier Inc.  

## 2022-06-22 NOTE — Progress Notes (Signed)
Margot Chimes 37 y.o.   Chief Complaint  Patient presents with   Follow-up    Would like to get lab work done    HISTORY OF PRESENT ILLNESS: This is a 37 y.o. male here for annual exam. Has no complaints or medical concerns today. Non-smoker.  Healthy lifestyle.  HPI   Prior to Admission medications   Medication Sig Start Date End Date Taking? Authorizing Provider  tobramycin (TOBREX) 0.3 % ophthalmic solution Place 1 drop into both eyes every 4 (four) hours. 06/14/22  Yes Jaynee Eagles, PA-C    No Known Allergies  There are no problems to display for this patient.   Past Medical History:  Diagnosis Date   High cholesterol     Past Surgical History:  Procedure Laterality Date   URETHRA SURGERY      Social History   Socioeconomic History   Marital status: Single    Spouse name: Not on file   Number of children: Not on file   Years of education: Not on file   Highest education level: Not on file  Occupational History   Not on file  Tobacco Use   Smoking status: Never   Smokeless tobacco: Never  Vaping Use   Vaping Use: Never used  Substance and Sexual Activity   Alcohol use: Yes    Comment: occasional   Drug use: Not Currently    Types: Marijuana   Sexual activity: Not on file  Other Topics Concern   Not on file  Social History Narrative   Not on file   Social Determinants of Health   Financial Resource Strain: Not on file  Food Insecurity: Not on file  Transportation Needs: Not on file  Physical Activity: Not on file  Stress: Not on file  Social Connections: Not on file  Intimate Partner Violence: Not on file    Family History  Problem Relation Age of Onset   Thyroid disease Mother    Cancer Sister      Review of Systems  Constitutional: Negative.  Negative for chills and fever.  HENT: Negative.  Negative for congestion and sore throat.   Eyes: Negative.   Respiratory: Negative.  Negative for cough and shortness of breath.    Cardiovascular: Negative.  Negative for chest pain and palpitations.  Gastrointestinal: Negative.  Negative for abdominal pain, diarrhea, nausea and vomiting.  Genitourinary: Negative.  Negative for dysuria and hematuria.  Skin: Negative.  Negative for rash.  Neurological: Negative.  Negative for dizziness and headaches.  All other systems reviewed and are negative.  Today's Vitals   06/22/22 0944  BP: 118/68  Pulse: 65  Temp: 97.9 F (36.6 C)  TempSrc: Oral  SpO2: 100%  Weight: 174 lb (78.9 kg)  Height: 5' 8.5" (1.74 m)   Body mass index is 26.07 kg/m.   Physical Exam Vitals reviewed.  HENT:     Head: Normocephalic.     Right Ear: Tympanic membrane, ear canal and external ear normal.     Left Ear: Tympanic membrane, ear canal and external ear normal.     Mouth/Throat:     Mouth: Mucous membranes are moist.     Pharynx: Oropharynx is clear.  Eyes:     Extraocular Movements: Extraocular movements intact.     Conjunctiva/sclera: Conjunctivae normal.     Pupils: Pupils are equal, round, and reactive to light.  Cardiovascular:     Rate and Rhythm: Normal rate and regular rhythm.     Pulses: Normal pulses.  Heart sounds: Normal heart sounds.  Pulmonary:     Effort: Pulmonary effort is normal.     Breath sounds: Normal breath sounds.  Abdominal:     Palpations: Abdomen is soft.     Tenderness: There is no abdominal tenderness.  Musculoskeletal:     Cervical back: No tenderness.     Right lower leg: No edema.     Left lower leg: No edema.  Lymphadenopathy:     Cervical: No cervical adenopathy.  Skin:    General: Skin is warm and dry.  Neurological:     General: No focal deficit present.     Mental Status: He is alert and oriented to person, place, and time.  Psychiatric:        Mood and Affect: Mood normal.        Behavior: Behavior normal.      ASSESSMENT & PLAN: Problem List Items Addressed This Visit   None Visit Diagnoses     Routine general  medical examination at a health care facility    -  Primary   Relevant Orders   CBC with Differential   Comprehensive metabolic panel   Hemoglobin A1c   Lipid panel   Screening for deficiency anemia       Relevant Orders   CBC with Differential   Screening for lipoid disorders       Relevant Orders   Lipid panel   Screening for endocrine, metabolic and immunity disorder       Relevant Orders   Comprehensive metabolic panel   Hemoglobin A1c      Modifiable risk factors discussed with patient. Anticipatory guidance according to age provided. The following topics were also discussed: Social Determinants of Health Smoking.  Non-smoker Diet and nutrition.  Advised to decrease amount of daily carbohydrate intake and increase amount of plant based protein in his diet. Benefits of exercise.  Exercises regularly Cancer family history review Vaccinations reviewed and recommendations Cardiovascular risk assessment and need for blood work Mental health including depression and anxiety Fall and accident prevention  Patient Instructions  Health Maintenance, Male Adopting a healthy lifestyle and getting preventive care are important in promoting health and wellness. Ask your health care provider about: The right schedule for you to have regular tests and exams. Things you can do on your own to prevent diseases and keep yourself healthy. What should I know about diet, weight, and exercise? Eat a healthy diet  Eat a diet that includes plenty of vegetables, fruits, low-fat dairy products, and lean protein. Do not eat a lot of foods that are high in solid fats, added sugars, or sodium. Maintain a healthy weight Body mass index (BMI) is a measurement that can be used to identify possible weight problems. It estimates body fat based on height and weight. Your health care provider can help determine your BMI and help you achieve or maintain a healthy weight. Get regular exercise Get regular  exercise. This is one of the most important things you can do for your health. Most adults should: Exercise for at least 150 minutes each week. The exercise should increase your heart rate and make you sweat (moderate-intensity exercise). Do strengthening exercises at least twice a week. This is in addition to the moderate-intensity exercise. Spend less time sitting. Even light physical activity can be beneficial. Watch cholesterol and blood lipids Have your blood tested for lipids and cholesterol at 35 years of age, then have this test every 5 years. You may need to  have your cholesterol levels checked more often if: Your lipid or cholesterol levels are high. You are older than 35 years of age. You are at high risk for heart disease. What should I know about cancer screening? Many types of cancers can be detected early and may often be prevented. Depending on your health history and family history, you may need to have cancer screening at various ages. This may include screening for: Colorectal cancer. Prostate cancer. Skin cancer. Lung cancer. What should I know about heart disease, diabetes, and high blood pressure? Blood pressure and heart disease High blood pressure causes heart disease and increases the risk of stroke. This is more likely to develop in people who have high blood pressure readings or are overweight. Talk with your health care provider about your target blood pressure readings. Have your blood pressure checked: Every 3-5 years if you are 73-60 years of age. Every year if you are 31 years old or older. If you are between the ages of 57 and 58 and are a current or former smoker, ask your health care provider if you should have a one-time screening for abdominal aortic aneurysm (AAA). Diabetes Have regular diabetes screenings. This checks your fasting blood sugar level. Have the screening done: Once every three years after age 3 if you are at a normal weight and have a  low risk for diabetes. More often and at a younger age if you are overweight or have a high risk for diabetes. What should I know about preventing infection? Hepatitis B If you have a higher risk for hepatitis B, you should be screened for this virus. Talk with your health care provider to find out if you are at risk for hepatitis B infection. Hepatitis C Blood testing is recommended for: Everyone born from [birth year] through 1965. Anyone with known risk factors for hepatitis C. Sexually transmitted infections (STIs) You should be screened each year for STIs, including gonorrhea and chlamydia, if: You are sexually active and are younger than 35 years of age. You are older than 35 years of age and your health care provider tells you that you are at risk for this type of infection. Your sexual activity has changed since you were last screened, and you are at increased risk for chlamydia or gonorrhea. Ask your health care provider if you are at risk. Ask your health care provider about whether you are at high risk for HIV. Your health care provider may recommend a prescription medicine to help prevent HIV infection. If you choose to take medicine to prevent HIV, you should first get tested for HIV. You should then be tested every 3 months for as long as you are taking the medicine. Follow these instructions at home: Alcohol use Do not drink alcohol if your health care provider tells you not to drink. If you drink alcohol: Limit how much you have to 0-2 drinks a day. Know how much alcohol is in your drink. In the [place] one drink equals one 12 oz bottle of beer (355 mL), one 5 oz glass of wine (148 mL), or one 1 oz glass of hard liquor (44 mL). Lifestyle Do not use any products that contain nicotine or tobacco. These products include cigarettes, chewing tobacco, and vaping devices, such as e-cigarettes. If you need help quitting, ask your health care provider. Do not use street drugs. Do not share  needles. Ask your health care provider for help if you need support or information about quitting drugs. General instructions  Schedule regular health, dental, and eye exams. Stay current with your vaccines. Tell your health care provider if: You often feel depressed. You have ever been abused or do not feel safe at home. Summary Adopting a healthy lifestyle and getting preventive care are important in promoting health and wellness. Follow your health care provider's instructions about healthy diet, exercising, and getting tested or screened for diseases. Follow your health care provider's instructions on monitoring your cholesterol and blood pressure. This information is not intended to replace advice given to you by your health care provider. Make sure you discuss any questions you have with your health care provider. Document Revised: 10/11/2020 Document Reviewed: 10/11/2020 Elsevier Patient Education  2023 Elsevier Inc.     Edwina Barth, MD [facility]

## 2022-08-21 ENCOUNTER — Encounter: Payer: Self-pay | Admitting: Emergency Medicine

## 2022-11-07 ENCOUNTER — Encounter: Payer: Self-pay | Admitting: Emergency Medicine

## 2022-11-07 ENCOUNTER — Telehealth (INDEPENDENT_AMBULATORY_CARE_PROVIDER_SITE_OTHER): Payer: Managed Care, Other (non HMO) | Admitting: Emergency Medicine

## 2022-11-07 DIAGNOSIS — R079 Chest pain, unspecified: Secondary | ICD-10-CM | POA: Insufficient documentation

## 2022-11-07 NOTE — Assessment & Plan Note (Signed)
Clinically stable.  No cardiac history No red flag signs or symptoms Needs proper evaluation in the office with EKG, chest x-ray, and blood work. May need cardiology evaluation Advised to contact the office and make an appointment for in person evaluation.

## 2022-11-07 NOTE — Progress Notes (Signed)
Telemedicine Encounter- SOAP NOTE Established Patient MyChart video encounter Patient: Home  Provider: Office   Patient present only  This video encounter was conducted with the patient's (or proxy's) verbal consent via video telecommunications: yes/no: Yes Patient was instructed to have this encounter in a suitably private space; and to only have persons present to whom they give permission to participate. In addition, patient identity was confirmed by use of name plus two identifiers (DOB and address).  I discussed the limitations, risks, security and privacy concerns of performing an evaluation and management service by telephone and the availability of in person appointments. I also discussed with the patient that there may be a patient responsible charge related to this service. The patient expressed understanding and agreed to proceed.   Chief complaint: Left-sided chest pain  Subjective   Evan Randall is a 37 y.o. male established patient. Telephone visit today complaining of intermittent left-sided chest pain almost daily for the past year without associated symptoms.  No cardiac history.  No associated symptoms.  Denies difficulty breathing, nausea or vomiting, abdominal pain, syncope, fever or chills. No other complaints or medical concerns today.  HPI   There are no problems to display for this patient.   Past Medical History:  Diagnosis Date   High cholesterol     Current Outpatient Medications  Medication Sig Dispense Refill   tobramycin (TOBREX) 0.3 % ophthalmic solution Place 1 drop into both eyes every 4 (four) hours. 5 mL 0   No current facility-administered medications for this visit.    No Known Allergies  Social History   Socioeconomic History   Marital status: Single    Spouse name: Not on file   Number of children: Not on file   Years of education: Not on file   Highest education level: Not on file  Occupational History   Not on file   Tobacco Use   Smoking status: Never   Smokeless tobacco: Never  Vaping Use   Vaping Use: Never used  Substance and Sexual Activity   Alcohol use: Yes    Comment: occasional   Drug use: Not Currently    Types: Marijuana   Sexual activity: Not on file  Other Topics Concern   Not on file  Social History Narrative   Not on file   Social Determinants of Health   Financial Resource Strain: Not on file  Food Insecurity: Not on file  Transportation Needs: Not on file  Physical Activity: Not on file  Stress: Not on file  Social Connections: Not on file  Intimate Partner Violence: Not on file    Review of Systems  Constitutional: Negative.  Negative for chills and fever.  HENT: Negative.  Negative for congestion and sore throat.   Respiratory: Negative.  Negative for cough and shortness of breath.   Cardiovascular:  Positive for chest pain. Negative for palpitations.  Gastrointestinal:  Negative for abdominal pain, diarrhea, nausea and vomiting.  Genitourinary: Negative.  Negative for dysuria and hematuria.  Skin: Negative.  Negative for rash.  Neurological: Negative.  Negative for dizziness and headaches.  All other systems reviewed and are negative.   Objective  Alert and oriented x 3 in no apparent respiratory distress Vitals as reported by the patient: There were no vitals filed for this visit.  Problem List Items Addressed This Visit       Other   Nonspecific chest pain - Primary    Clinically stable.  No cardiac history No red  flag signs or symptoms Needs proper evaluation in the office with EKG, chest x-ray, and blood work. May need cardiology evaluation Advised to contact the office and make an appointment for in person evaluation.         I discussed the assessment and treatment plan with the patient. The patient was provided an opportunity to ask questions and all were answered. The patient agreed with the plan and demonstrated an understanding of the  instructions.   The patient was advised to call back or seek an in-person evaluation if the symptoms worsen or if the condition fails to improve as anticipated.  I provided 20 minutes of non-face-to-face time during this encounter including preparing for this visit, review of most recent office visit notes, differential diagnosis of chest pain and need for office evaluation.  Georgina Quint, MD  Primary Care at [facility]

## 2022-11-09 ENCOUNTER — Ambulatory Visit: Payer: Managed Care, Other (non HMO) | Admitting: Emergency Medicine

## 2022-11-14 ENCOUNTER — Ambulatory Visit: Payer: Managed Care, Other (non HMO) | Admitting: Emergency Medicine

## 2022-11-27 ENCOUNTER — Ambulatory Visit: Payer: Managed Care, Other (non HMO) | Admitting: Emergency Medicine

## 2022-12-05 ENCOUNTER — Ambulatory Visit (INDEPENDENT_AMBULATORY_CARE_PROVIDER_SITE_OTHER): Payer: Managed Care, Other (non HMO)

## 2022-12-05 ENCOUNTER — Encounter: Payer: Self-pay | Admitting: Emergency Medicine

## 2022-12-05 ENCOUNTER — Ambulatory Visit: Payer: Managed Care, Other (non HMO) | Admitting: Emergency Medicine

## 2022-12-05 VITALS — BP 130/82 | HR 71 | Temp 98.4°F | Ht 68.5 in | Wt 185.2 lb

## 2022-12-05 DIAGNOSIS — Z8249 Family history of ischemic heart disease and other diseases of the circulatory system: Secondary | ICD-10-CM

## 2022-12-05 DIAGNOSIS — Z113 Encounter for screening for infections with a predominantly sexual mode of transmission: Secondary | ICD-10-CM

## 2022-12-05 DIAGNOSIS — R079 Chest pain, unspecified: Secondary | ICD-10-CM | POA: Diagnosis not present

## 2022-12-05 NOTE — Patient Instructions (Signed)
Nonspecific Chest Pain Chest pain can be caused by many different conditions. Some causes of chest pain can be life-threatening. These will require treatment right away. Serious causes of chest pain include: Heart attack. A tear in the body's main blood vessel. Redness and swelling (inflammation) around your heart. Blood clot in your lungs. Other causes of chest pain may not be so serious. These include: Heartburn. Anxiety or stress. Damage to bones or muscles in your chest. Lung infections. Chest pain can feel like: Pain or discomfort in your chest. Crushing, pressure, aching, or squeezing pain. Burning or tingling. Dull or sharp pain that is worse when you move, cough, or take a deep breath. Pain or discomfort that is also felt in your back, neck, jaw, shoulder, or arm, or pain that spreads to any of these areas. It is hard to know whether your pain is caused by something that is serious or something that is not so serious. So it is important to see your doctor right away if you have chest pain. Follow these instructions at home: Medicines Take over-the-counter and prescription medicines only as told by your doctor. If you were prescribed an antibiotic medicine, take it as told by your doctor. Do not stop taking the antibiotic even if you start to feel better. Lifestyle  Rest as told by your doctor. Do not use any products that contain nicotine or tobacco, such as cigarettes, e-cigarettes, and chewing tobacco. If you need help quitting, ask your doctor. Do not drink alcohol. Make lifestyle changes as told by your doctor. These may include: Getting regular exercise. Ask your doctor what activities are safe for you. Eating a heart-healthy diet. A diet and nutrition specialist (dietitian) can help you to learn healthy eating options. Staying at a healthy weight. Treating diabetes or high blood pressure, if needed. Lowering your stress. Activities such as yoga and relaxation techniques  can help. General instructions Pay attention to any changes in your symptoms. Tell your doctor about them or any new symptoms. Avoid any activities that cause chest pain. Keep all follow-up visits as told by your doctor. This is important. You may need more testing if your chest pain does not go away. Contact a doctor if: Your chest pain does not go away. You feel depressed. You have a fever. Get help right away if: Your chest pain is worse. You have a cough that gets worse, or you cough up blood. You have very bad (severe) pain in your belly (abdomen). You pass out (faint). You have either of these for no clear reason: Sudden chest discomfort. Sudden discomfort in your arms, back, neck, or jaw. You have shortness of breath at any time. You suddenly start to sweat, or your skin gets clammy. You feel sick to your stomach (nauseous). You throw up (vomit). You suddenly feel lightheaded or dizzy. You feel very weak or tired. Your heart starts to beat fast, or it feels like it is skipping beats. These symptoms may be an emergency. Do not wait to see if the symptoms will go away. Get medical help right away. Call your local emergency services (911 in the [place] Do not drive yourself to the hospital. Summary Chest pain can be caused by many different conditions. The cause may be serious and need treatment right away. If you have chest pain, see your doctor right away. Follow your doctor's instructions for taking medicines and making lifestyle changes. Keep all follow-up visits as told by your doctor. This includes visits for any further   testing if your chest pain does not go away. Be sure to know the signs that show that your condition has become worse. Get help right away if you have these symptoms. This information is not intended to replace advice given to you by your health care provider. Make sure you discuss any questions you have with your health care provider. Document Revised:  04/06/2022 Document Reviewed: 04/06/2022 Elsevier Patient Education  2024 Elsevier Inc.  

## 2022-12-05 NOTE — Assessment & Plan Note (Signed)
Clinically stable.  No red flag signs or symptoms Benign physical examination Family history of heart disease Normal EKG Chest x-ray report done today reviewed Recommend cardiology evaluation Referral placed today

## 2022-12-05 NOTE — Progress Notes (Signed)
Evan Randall 37 y.o.   Chief Complaint  Patient presents with   chest pains    Off and on pains, patient states he has been having these pains for a while, more so on the left side of his chest.   Patient wants  STI testing as well     HISTORY OF PRESENT ILLNESS: This is a 37 y.o. male complaining of frequent episodes of chest tightness lasting 5 to 10 minutes without radiation but at times associated with nausea and difficulty breathing. Has family history of heart disease.  Non-smoker. No other associated symptoms Also requesting STD screening.  Asymptomatic.  HPI   Prior to Admission medications   Not on File    No Known Allergies  Patient Active Problem List   Diagnosis Date Noted   Nonspecific chest pain 11/07/2022    Past Medical History:  Diagnosis Date   High cholesterol     Past Surgical History:  Procedure Laterality Date   URETHRA SURGERY      Social History   Socioeconomic History   Marital status: Single    Spouse name: Not on file   Number of children: Not on file   Years of education: Not on file   Highest education level: Not on file  Occupational History   Not on file  Tobacco Use   Smoking status: Never   Smokeless tobacco: Never  Vaping Use   Vaping Use: Never used  Substance and Sexual Activity   Alcohol use: Yes    Comment: occasional   Drug use: Not Currently    Types: Marijuana   Sexual activity: Not on file  Other Topics Concern   Not on file  Social History Narrative   Not on file   Social Determinants of Health   Financial Resource Strain: Not on file  Food Insecurity: Not on file  Transportation Needs: Not on file  Physical Activity: Not on file  Stress: Not on file  Social Connections: Not on file  Intimate Partner Violence: Not on file    Family History  Problem Relation Age of Onset   Thyroid disease Mother    Cancer Sister      Review of Systems  Constitutional: Negative.  Negative for fever.   HENT: Negative.  Negative for congestion and sore throat.   Respiratory: Negative.  Negative for cough and shortness of breath.   Cardiovascular:  Positive for chest pain. Negative for palpitations.  Gastrointestinal:  Negative for abdominal pain, diarrhea, nausea and vomiting.  Genitourinary: Negative.  Negative for dysuria and hematuria.  Skin: Negative.  Negative for rash.  Neurological: Negative.  Negative for dizziness and headaches.  All other systems reviewed and are negative.   Vitals:   12/05/22 0903  BP: 130/82  Pulse: 71  Temp: 98.4 F (36.9 C)  SpO2: 94%    Physical Exam Vitals reviewed.  Constitutional:      Appearance: Normal appearance.  HENT:     Head: Normocephalic.     Mouth/Throat:     Mouth: Mucous membranes are moist.     Pharynx: Oropharynx is clear.  Eyes:     Extraocular Movements: Extraocular movements intact.     Conjunctiva/sclera: Conjunctivae normal.     Pupils: Pupils are equal, round, and reactive to light.  Cardiovascular:     Rate and Rhythm: Normal rate and regular rhythm.     Pulses: Normal pulses.     Heart sounds: Normal heart sounds.  Pulmonary:  Effort: Pulmonary effort is normal.     Breath sounds: Normal breath sounds.  Musculoskeletal:     Cervical back: No tenderness.  Lymphadenopathy:     Cervical: No cervical adenopathy.  Skin:    General: Skin is warm and dry.     Capillary Refill: Capillary refill takes less than 2 seconds.  Neurological:     General: No focal deficit present.     Mental Status: He is alert and oriented to person, place, and time.  Psychiatric:        Mood and Affect: Mood normal.        Behavior: Behavior normal.     EKG: Normal sinus rhythm with ventricular rate of 65/min.  Borderline LVH.  No acute ischemic changes.    ASSESSMENT & PLAN: A total of 46 minutes was spent with the patient and counseling/coordination of care regarding preparing for this visit, review of most recent office  visit notes, review of most recent blood work results, differential diagnosis of chest pain and need for workup, review of chest x-ray report done today, need for cardiology evaluation, prognosis, documentation, ED precautions, and need for follow-up.  Problem List Items Addressed This Visit       Other   Nonspecific chest pain - Primary    Clinically stable.  No red flag signs or symptoms Benign physical examination Family history of heart disease Normal EKG Chest x-ray report done today reviewed Recommend cardiology evaluation Referral placed today      Relevant Orders   EKG 12-Lead   CBC with Differential/Platelet   Comprehensive metabolic panel   Hemoglobin A1c   Lipid panel   DG Chest 2 View   Ambulatory referral to Cardiology   Other Visit Diagnoses     Screening examination for STD (sexually transmitted disease)       Relevant Orders   Hepatitis C antibody   HIV Antibody (routine testing w rflx)   RPR   GC/Chlamydia Probe Amp   Family history of heart disease       Relevant Orders   Ambulatory referral to Cardiology      Patient Instructions  Nonspecific Chest Pain Chest pain can be caused by many different conditions. Some causes of chest pain can be life-threatening. These will require treatment right away. Serious causes of chest pain include: Heart attack. A tear in the body's main blood vessel. Redness and swelling (inflammation) around your heart. Blood clot in your lungs. Other causes of chest pain may not be so serious. These include: Heartburn. Anxiety or stress. Damage to bones or muscles in your chest. Lung infections. Chest pain can feel like: Pain or discomfort in your chest. Crushing, pressure, aching, or squeezing pain. Burning or tingling. Dull or sharp pain that is worse when you move, cough, or take a deep breath. Pain or discomfort that is also felt in your back, neck, jaw, shoulder, or arm, or pain that spreads to any of these  areas. It is hard to know whether your pain is caused by something that is serious or something that is not so serious. So it is important to see your doctor right away if you have chest pain. Follow these instructions at home: Medicines Take over-the-counter and prescription medicines only as told by your doctor. If you were prescribed an antibiotic medicine, take it as told by your doctor. Do not stop taking the antibiotic even if you start to feel better. Lifestyle  Rest as told by your doctor. Do not  use any products that contain nicotine or tobacco, such as cigarettes, e-cigarettes, and chewing tobacco. If you need help quitting, ask your doctor. Do not drink alcohol. Make lifestyle changes as told by your doctor. These may include: Getting regular exercise. Ask your doctor what activities are safe for you. Eating a heart-healthy diet. A diet and nutrition specialist (dietitian) can help you to learn healthy eating options. Staying at a healthy weight. Treating diabetes or high blood pressure, if needed. Lowering your stress. Activities such as yoga and relaxation techniques can help. General instructions Pay attention to any changes in your symptoms. Tell your doctor about them or any new symptoms. Avoid any activities that cause chest pain. Keep all follow-up visits as told by your doctor. This is important. You may need more testing if your chest pain does not go away. Contact a doctor if: Your chest pain does not go away. You feel depressed. You have a fever. Get help right away if: Your chest pain is worse. You have a cough that gets worse, or you cough up blood. You have very bad (severe) pain in your belly (abdomen). You pass out (faint). You have either of these for no clear reason: Sudden chest discomfort. Sudden discomfort in your arms, back, neck, or jaw. You have shortness of breath at any time. You suddenly start to sweat, or your skin gets clammy. You feel sick  to your stomach (nauseous). You throw up (vomit). You suddenly feel lightheaded or dizzy. You feel very weak or tired. Your heart starts to beat fast, or it feels like it is skipping beats. These symptoms may be an emergency. Do not wait to see if the symptoms will go away. Get medical help right away. Call your local emergency services (911 in the [place] Do not drive yourself to the hospital. Summary Chest pain can be caused by many different conditions. The cause may be serious and need treatment right away. If you have chest pain, see your doctor right away. Follow your doctor's instructions for taking medicines and making lifestyle changes. Keep all follow-up visits as told by your doctor. This includes visits for any further testing if your chest pain does not go away. Be sure to know the signs that show that your condition has become worse. Get help right away if you have these symptoms. This information is not intended to replace advice given to you by your health care provider. Make sure you discuss any questions you have with your health care provider. Document Revised: 04/06/2022 Document Reviewed: 04/06/2022 Elsevier Patient Education  2024 Elsevier Inc.    Edwina Barth, MD [facility] [facility]

## 2022-12-06 LAB — COMPREHENSIVE METABOLIC PANEL
ALT: 15 U/L (ref 0–53)
AST: 20 U/L (ref 0–37)
Albumin: 4.3 g/dL (ref 3.5–5.2)
Alkaline Phosphatase: 54 U/L (ref 39–117)
BUN: 20 mg/dL (ref 6–23)
CO2: 27 mEq/L (ref 19–32)
Calcium: 9.2 mg/dL (ref 8.4–10.5)
Chloride: 105 mEq/L (ref 96–112)
Creatinine, Ser: 1.03 mg/dL (ref 0.40–1.50)
GFR: 94.49 mL/min (ref >60.00)
Glucose, Bld: 89 mg/dL (ref 70–99)
Potassium: 4.2 mEq/L (ref 3.5–5.1)
Sodium: 140 mEq/L (ref 135–145)
Total Bilirubin: 0.4 mg/dL (ref 0.2–1.2)
Total Protein: 7.4 g/dL (ref 6.0–8.3)

## 2022-12-06 LAB — HEPATITIS C ANTIBODY: Hepatitis C Ab: NONREACTIVE

## 2022-12-06 LAB — CBC WITH DIFFERENTIAL/PLATELET
Basophils Absolute: 0.1 10*3/uL (ref 0.0–0.1)
Basophils Relative: 0.9 % (ref 0.0–3.0)
Eosinophils Absolute: 0.2 10*3/uL (ref 0.0–0.7)
Eosinophils Relative: 3.2 % (ref 0.0–5.0)
HCT: 43.9 % (ref 39.0–52.0)
Hemoglobin: 14.2 g/dL (ref 13.0–17.0)
Lymphocytes Relative: 30.6 % (ref 12.0–46.0)
Lymphs Abs: 1.8 10*3/uL (ref 0.7–4.0)
MCHC: 32.4 g/dL (ref 30.0–36.0)
MCV: 84.4 fl (ref 78.0–100.0)
Monocytes Absolute: 0.4 10*3/uL (ref 0.1–1.0)
Monocytes Relative: 5.8 % (ref 3.0–12.0)
Neutro Abs: 3.6 10*3/uL (ref 1.4–7.7)
Neutrophils Relative %: 59.5 % (ref 43.0–77.0)
Platelets: 176 10*3/uL (ref 150.0–400.0)
RBC: 5.2 Mil/uL (ref 4.22–5.81)
RDW: 12.9 % (ref 11.5–15.5)
WBC: 6 10*3/uL (ref 4.0–10.5)

## 2022-12-06 LAB — LIPID PANEL
Cholesterol: 189 mg/dL (ref 0–200)
HDL: 59.3 mg/dL (ref >39.00)
LDL Cholesterol: 115 mg/dL — ABNORMAL HIGH (ref 0–99)
NonHDL: 129.89
Total CHOL/HDL Ratio: 3
Triglycerides: 74 mg/dL (ref 0.0–149.0)
VLDL: 14.8 mg/dL (ref 0.0–40.0)

## 2022-12-06 LAB — RPR: RPR Ser Ql: NONREACTIVE

## 2022-12-06 LAB — HIV ANTIBODY (ROUTINE TESTING W REFLEX): HIV 1&2 Ab, 4th Generation: NONREACTIVE

## 2022-12-06 LAB — HEMOGLOBIN A1C: Hgb A1c MFr Bld: 5.4 % (ref 4.6–6.5)

## 2022-12-07 LAB — GC/CHLAMYDIA PROBE AMP
Chlamydia trachomatis, NAA: NEGATIVE
Neisseria Gonorrhoeae by PCR: NEGATIVE

## 2022-12-12 ENCOUNTER — Encounter: Payer: Self-pay | Admitting: Emergency Medicine

## 2022-12-21 ENCOUNTER — Encounter: Payer: Self-pay | Admitting: Emergency Medicine

## 2022-12-21 NOTE — Telephone Encounter (Signed)
Should be seen and evaluated for this. Anyone available in the office can see him or he could go to urgent care center.

## 2022-12-26 ENCOUNTER — Ambulatory Visit: Payer: Managed Care, Other (non HMO) | Admitting: Emergency Medicine

## 2023-01-08 ENCOUNTER — Ambulatory Visit: Payer: Managed Care, Other (non HMO)

## 2023-01-11 ENCOUNTER — Encounter: Payer: Self-pay | Admitting: Emergency Medicine

## 2023-01-11 NOTE — Telephone Encounter (Signed)
Needs office visit or urgent care visit.

## 2023-01-15 ENCOUNTER — Ambulatory Visit: Payer: Managed Care, Other (non HMO) | Admitting: Emergency Medicine

## 2023-02-19 ENCOUNTER — Ambulatory Visit: Payer: Managed Care, Other (non HMO) | Admitting: Internal Medicine

## 2023-03-12 ENCOUNTER — Ambulatory Visit: Payer: Managed Care, Other (non HMO) | Attending: Internal Medicine | Admitting: Cardiovascular Disease

## 2023-06-07 ENCOUNTER — Ambulatory Visit
Admission: EM | Admit: 2023-06-07 | Discharge: 2023-06-07 | Disposition: A | Discharge status: Home / Self Care | Payer: BC Managed Care – PPO | Attending: Family Medicine | Admitting: Family Medicine

## 2023-06-07 ENCOUNTER — Ambulatory Visit (INDEPENDENT_AMBULATORY_CARE_PROVIDER_SITE_OTHER): Payer: BC Managed Care – PPO

## 2023-06-07 DIAGNOSIS — R051 Acute cough: Secondary | ICD-10-CM | POA: Diagnosis not present

## 2023-06-07 DIAGNOSIS — R059 Cough, unspecified: Secondary | ICD-10-CM | POA: Diagnosis not present

## 2023-06-07 DIAGNOSIS — J22 Unspecified acute lower respiratory infection: Secondary | ICD-10-CM

## 2023-06-07 DIAGNOSIS — R509 Fever, unspecified: Secondary | ICD-10-CM | POA: Diagnosis not present

## 2023-06-07 MED ORDER — AZITHROMYCIN 250 MG PO TABS
250.0000 mg | ORAL_TABLET | Freq: Every day | ORAL | 0 refills | Status: DC
Start: 2023-06-10 — End: 2023-06-20

## 2023-06-07 MED ORDER — PROMETHAZINE-DM 6.25-15 MG/5ML PO SYRP
5.0000 mL | ORAL_SOLUTION | Freq: Four times a day (QID) | ORAL | 0 refills | Status: DC | PRN
Start: 2023-06-07 — End: 2023-06-20

## 2023-06-07 NOTE — ED Triage Notes (Signed)
 Pt presents with c/o productive cough, chest tightness, body aches X 1 wk.  Family members at home have had bronchitis and flu   Home interventions: Nyquil, Mucinex, Dayquil

## 2023-06-07 NOTE — ED Provider Notes (Signed)
 [facility]    CSN: [id number] Arrival date & time: 06/07/23  0836      History   Chief Complaint Chief Complaint  Patient presents with   Cough   Generalized Body Aches   Chest Pain    HPI Evan Randall is a 38 y.o. male  presents for evaluation of URI symptoms for 7 days. Patient reports associated symptoms of cough, chest tightness with mild shortness of breath and body aches. Denies N/V/D, sore throat, fevers, ear pain. Patient does not have a hx of asthma. Patient is not an active smoker.   Reports daughter has flu.  Pt has taken NyQuil, Mucinex OTC for symptoms. Pt has no other concerns at this time.    Cough Associated symptoms: fever, myalgias and shortness of breath   Chest Pain Associated symptoms: cough, fever and shortness of breath     Past Medical History:  Diagnosis Date   High cholesterol     Patient Active Problem List   Diagnosis Date Noted   Nonspecific chest pain 11/07/2022    Past Surgical History:  Procedure Laterality Date   URETHRA SURGERY         Home Medications    Prior to Admission medications   Medication Sig Start Date End Date Taking? Authorizing Provider  azithromycin  (ZITHROMAX ) 250 MG tablet Take 1 tablet (250 mg total) by mouth daily. Take first 2 tablets together, then 1 every day until finished. 06/10/23  Yes Erisa Mehlman, Jodi R, NP  promethazine -dextromethorphan (PROMETHAZINE -DM) 6.25-15 MG/5ML syrup Take 5 mLs by mouth 4 (four) times daily as needed for cough. 06/07/23  Yes Loreda Myla SAUNDERS, NP    Family History Family History  Problem Relation Age of Onset   Thyroid disease Mother    Cancer Sister     Social History Social History   Tobacco Use   Smoking status: Never   Smokeless tobacco: Never  Vaping Use   Vaping status: Never Used  Substance Use Topics   Alcohol use: Yes    Comment: occasional   Drug use: Not Currently    Types: Marijuana     Allergies   Patient has no known allergies.   Review  of Systems Review of Systems  Constitutional:  Positive for fever.  Respiratory:  Positive for cough, chest tightness and shortness of breath.   Musculoskeletal:  Positive for myalgias.     Physical Exam Triage Vital Signs ED Triage Vitals  Encounter Vitals Group     BP 06/07/23 0943 122/79     Systolic BP Percentile --      Diastolic BP Percentile --      Pulse Rate 06/07/23 0943 85     Resp 06/07/23 0943 18     Temp 06/07/23 0943 99.6 F (37.6 C)     Temp Source 06/07/23 0943 Oral     SpO2 06/07/23 0943 95 %     Weight --      Height --      Head Circumference --      Peak Flow --      Pain Score 06/07/23 0942 4     Pain Loc --      Pain Education --      Exclude from Growth Chart --    No data found.  Updated Vital Signs BP 122/79 (BP Location: Left Arm)   Pulse 85   Temp 99.6 F (37.6 C) (Oral)   Resp 18   SpO2 95%   Visual Acuity  Right Eye Distance:   Left Eye Distance:   Bilateral Distance:    Right Eye Near:   Left Eye Near:    Bilateral Near:     Physical Exam Vitals and nursing note reviewed.  Constitutional:      General: He is not in acute distress.    Appearance: Normal appearance. He is not ill-appearing or toxic-appearing.  HENT:     Head: Normocephalic and atraumatic.     Right Ear: Tympanic membrane and ear canal normal.     Left Ear: Tympanic membrane and ear canal normal.     Nose: Congestion present.     Mouth/Throat:     Mouth: Mucous membranes are moist.     Pharynx: No oropharyngeal exudate or posterior oropharyngeal erythema.  Eyes:     Pupils: Pupils are equal, round, and reactive to light.  Cardiovascular:     Rate and Rhythm: Normal rate and regular rhythm.     Heart sounds: Normal heart sounds.  Pulmonary:     Effort: Pulmonary effort is normal.     Breath sounds: Normal breath sounds.  Musculoskeletal:     Cervical back: Normal range of motion and neck supple.  Lymphadenopathy:     Cervical: No cervical adenopathy.   Skin:    General: Skin is warm and dry.  Neurological:     General: No focal deficit present.     Mental Status: He is alert and oriented to person, place, and time.  Psychiatric:        Mood and Affect: Mood normal.        Behavior: Behavior normal.      UC Treatments / Results  Labs (all labs ordered are listed, but only abnormal results are displayed) Labs Reviewed - No data to display  EKG   Radiology No results found.  Procedures Procedures (including critical care time)  Medications Ordered in UC Medications - No data to display  Initial Impression / Assessment and Plan / UC Course  I have reviewed the triage vital signs and the nursing notes.  Pertinent labs & imaging results that were available during my care of the patient were reviewed by me and considered in my medical decision making (see chart for details).     Reviewed exam and symptoms with patient.  He is well-appearing and in no acute distress.  Wet read of x-ray without obvious consolidation, will contact for any positive results based on radiology overread.  Discussed bronchitis.  Patient prefers not to start antibiotics, provisional prescription for Zithromax  provided and instructed to take if symptoms do not improve or worsen by 1/5.  Promethazine  DM as needed for cough.  PCP follow-up 2 days for recheck.  ER precautions reviewed. Final Clinical Impressions(s) / UC Diagnoses   Final diagnoses:  Acute cough  Lower respiratory infection (e.g., bronchitis, pneumonia, pneumonitis, pulmonitis)     Discharge Instructions      You may start Promethazine  DM as needed for cough.  Please note this medication can make you drowsy.  Do not drink alcohol or drive on this medication.  Provisional prescription for azithromycin  which is an antibiotic has been provided.  You may start this in a couple of days if your symptoms not improving or they are worsening.  Lots of rest and fluids.  Over-the-counter Tylenol  ibuprofen as needed for fever management.  Please follow-up with your PCP in 2 days for recheck.  Please go to the ER for any worsening symptoms.  I hope  you feel better soon!     ED Prescriptions     Medication Sig Dispense Auth. Provider   promethazine -dextromethorphan (PROMETHAZINE -DM) 6.25-15 MG/5ML syrup Take 5 mLs by mouth 4 (four) times daily as needed for cough. 118 mL Zailey Audia, Jodi R, NP   azithromycin  (ZITHROMAX ) 250 MG tablet Take 1 tablet (250 mg total) by mouth daily. Take first 2 tablets together, then 1 every day until finished. 6 tablet Tanaisha Pittman, Jodi R, NP      PDMP not reviewed this encounter.   Loreda Myla SAUNDERS, NP 06/07/23 1032

## 2023-06-07 NOTE — Discharge Instructions (Signed)
 You may start Promethazine  DM as needed for cough.  Please note this medication can make you drowsy.  Do not drink alcohol or drive on this medication.  Provisional prescription for azithromycin  which is an antibiotic has been provided.  You may start this in a couple of days if your symptoms not improving or they are worsening.  Lots of rest and fluids.  Over-the-counter Tylenol ibuprofen as needed for fever management.  Please follow-up with your PCP in 2 days for recheck.  Please go to the ER for any worsening symptoms.  I hope you feel better soon!

## 2023-06-20 ENCOUNTER — Ambulatory Visit (INDEPENDENT_AMBULATORY_CARE_PROVIDER_SITE_OTHER): Payer: BC Managed Care – PPO | Admitting: Emergency Medicine

## 2023-06-20 ENCOUNTER — Encounter: Payer: Self-pay | Admitting: Emergency Medicine

## 2023-06-20 VITALS — BP 112/70 | HR 79 | Temp 97.7°F | Ht 68.5 in | Wt 194.0 lb

## 2023-06-20 DIAGNOSIS — Z0000 Encounter for general adult medical examination without abnormal findings: Secondary | ICD-10-CM

## 2023-06-20 DIAGNOSIS — Z13228 Encounter for screening for other metabolic disorders: Secondary | ICD-10-CM | POA: Diagnosis not present

## 2023-06-20 DIAGNOSIS — Z113 Encounter for screening for infections with a predominantly sexual mode of transmission: Secondary | ICD-10-CM

## 2023-06-20 DIAGNOSIS — Z130 Encounter for screening for diseases of the blood and blood-forming organs and certain disorders involving the immune mechanism: Secondary | ICD-10-CM

## 2023-06-20 DIAGNOSIS — Z13220 Encounter for screening for lipoid disorders: Secondary | ICD-10-CM

## 2023-06-20 DIAGNOSIS — Z1329 Encounter for screening for other suspected endocrine disorder: Secondary | ICD-10-CM

## 2023-06-20 LAB — CBC WITH DIFFERENTIAL/PLATELET
Basophils Absolute: 0 10*3/uL (ref 0.0–0.1)
Basophils Relative: 0.6 % (ref 0.0–3.0)
Eosinophils Absolute: 0.1 10*3/uL (ref 0.0–0.7)
Eosinophils Relative: 1.4 % (ref 0.0–5.0)
HCT: 46.1 % (ref 39.0–52.0)
Hemoglobin: 15.1 g/dL (ref 13.0–17.0)
Lymphocytes Relative: 29.6 % (ref 12.0–46.0)
Lymphs Abs: 1.5 10*3/uL (ref 0.7–4.0)
MCHC: 32.8 g/dL (ref 30.0–36.0)
MCV: 84.2 fL (ref 78.0–100.0)
Monocytes Absolute: 0.3 10*3/uL (ref 0.1–1.0)
Monocytes Relative: 7 % (ref 3.0–12.0)
Neutro Abs: 3.1 10*3/uL (ref 1.4–7.7)
Neutrophils Relative %: 61.4 % (ref 43.0–77.0)
Platelets: 223 10*3/uL (ref 150.0–400.0)
RBC: 5.47 Mil/uL (ref 4.22–5.81)
RDW: 12.8 % (ref 11.5–15.5)
WBC: 5 10*3/uL (ref 4.0–10.5)

## 2023-06-20 LAB — LIPID PANEL
Cholesterol: 205 mg/dL — ABNORMAL HIGH (ref 0–200)
HDL: 51.6 mg/dL (ref >39.00)
LDL Cholesterol: 137 mg/dL — ABNORMAL HIGH (ref 0–99)
NonHDL: 153.76
Total CHOL/HDL Ratio: 4
Triglycerides: 85 mg/dL (ref 0.0–149.0)
VLDL: 17 mg/dL (ref 0.0–40.0)

## 2023-06-20 LAB — VITAMIN B12: Vitamin B-12: 632 pg/mL (ref 211–911)

## 2023-06-20 LAB — COMPREHENSIVE METABOLIC PANEL
ALT: 17 U/L (ref 0–53)
AST: 21 U/L (ref 0–37)
Albumin: 4.6 g/dL (ref 3.5–5.2)
Alkaline Phosphatase: 68 U/L (ref 39–117)
BUN: 15 mg/dL (ref 6–23)
CO2: 28 meq/L (ref 19–32)
Calcium: 9.6 mg/dL (ref 8.4–10.5)
Chloride: 104 meq/L (ref 96–112)
Creatinine, Ser: 0.81 mg/dL (ref 0.40–1.50)
GFR: 114.26 mL/min (ref >60.00)
Glucose, Bld: 81 mg/dL (ref 70–99)
Potassium: 4 meq/L (ref 3.5–5.1)
Sodium: 140 meq/L (ref 135–145)
Total Bilirubin: 0.6 mg/dL (ref 0.2–1.2)
Total Protein: 7.7 g/dL (ref 6.0–8.3)

## 2023-06-20 LAB — VITAMIN D 25 HYDROXY (VIT D DEFICIENCY, FRACTURES): VITD: 9.61 ng/mL — ABNORMAL LOW (ref 30.00–100.00)

## 2023-06-20 LAB — TSH: TSH: 1.16 u[IU]/mL (ref 0.35–5.50)

## 2023-06-20 LAB — HEMOGLOBIN A1C: Hgb A1c MFr Bld: 5.9 % (ref 4.6–6.5)

## 2023-06-20 NOTE — Progress Notes (Signed)
 Evan Randall 38 y.o.   Chief Complaint  Patient presents with   Annual Exam    Patient is here for physical. Patient wants to have a STD testing. Wants his moles looked at and will do referral for dermatology. Referral to a nutrition/ allergy? What's to know what foods doesn't sit well with his body     HISTORY OF PRESENT ILLNESS: This is a 38 y.o. male here for annual exam. Healthy male with a healthy lifestyle Sees dermatologist on a regular basis Concerned about food and nutrition No other complaints or medical concerns today.  HPI   Prior to Admission medications   Not on File    No Known Allergies  There are no active problems to display for this patient.   Past Medical History:  Diagnosis Date   High cholesterol     Past Surgical History:  Procedure Laterality Date   URETHRA SURGERY      Social History   Socioeconomic History   Marital status: Single    Spouse name: Not on file   Number of children: Not on file   Years of education: Not on file   Highest education level: Not on file  Occupational History   Not on file  Tobacco Use   Smoking status: Never   Smokeless tobacco: Never  Vaping Use   Vaping status: Never Used  Substance and Sexual Activity   Alcohol use: Yes    Comment: occasional   Drug use: Not Currently    Types: Marijuana   Sexual activity: Not on file  Other Topics Concern   Not on file  Social History Narrative   Not on file   Social Drivers of Health   Financial Resource Strain: Not on file  Food Insecurity: Not on file  Transportation Needs: Not on file  Physical Activity: Not on file  Stress: Not on file  Social Connections: Not on file  Intimate Partner Violence: Not on file    Family History  Problem Relation Age of Onset   Thyroid disease Mother    Cancer Sister      Review of Systems  Constitutional: Negative.  Negative for chills and fever.  HENT: Negative.  Negative for congestion and sore throat.    Respiratory: Negative.  Negative for cough and shortness of breath.   Cardiovascular: Negative.  Negative for chest pain and palpitations.  Gastrointestinal:  Negative for abdominal pain, nausea and vomiting.  Genitourinary: Negative.  Negative for dysuria and hematuria.  Skin: Negative.  Negative for rash.  Neurological: Negative.  Negative for dizziness and headaches.  All other systems reviewed and are negative.   Vitals:   06/20/23 0831  BP: 112/70  Pulse: 79  Temp: 97.7 F (36.5 C)  SpO2: 97%    Physical Exam Vitals reviewed.  Constitutional:      Appearance: Normal appearance.  HENT:     Head: Normocephalic.     Right Ear: Tympanic membrane, ear canal and external ear normal.     Left Ear: Tympanic membrane, ear canal and external ear normal.     Mouth/Throat:     Mouth: Mucous membranes are moist.     Pharynx: Oropharynx is clear.  Eyes:     Extraocular Movements: Extraocular movements intact.     Conjunctiva/sclera: Conjunctivae normal.     Pupils: Pupils are equal, round, and reactive to light.  Cardiovascular:     Rate and Rhythm: Normal rate and regular rhythm.     Pulses: Normal  pulses.     Heart sounds: Normal heart sounds.  Pulmonary:     Effort: Pulmonary effort is normal.     Breath sounds: Normal breath sounds.  Abdominal:     Palpations: Abdomen is soft.     Tenderness: There is no abdominal tenderness.  Musculoskeletal:     Cervical back: No tenderness.     Right lower leg: No edema.     Left lower leg: No edema.  Lymphadenopathy:     Cervical: No cervical adenopathy.  Skin:    General: Skin is warm and dry.  Neurological:     General: No focal deficit present.     Mental Status: He is alert and oriented to person, place, and time.  Psychiatric:        Mood and Affect: Mood normal.        Behavior: Behavior normal.      ASSESSMENT & PLAN: Problem List Items Addressed This Visit   None Visit Diagnoses       Routine general medical  examination at a health care facility    -  Primary   Relevant Orders   TSH   Vitamin B12   VITAMIN D  25 Hydroxy (Vit-D Deficiency, Fractures)   CBC with Differential/Platelet   Comprehensive metabolic panel   Hemoglobin A1c   Lipid panel   HIV Antibody (routine testing w rflx)   Hepatitis C antibody   RPR     Screening for deficiency anemia       Relevant Orders   CBC with Differential/Platelet     Screening for lipoid disorders       Relevant Orders   Lipid panel     Screening for endocrine, metabolic and immunity disorder       Relevant Orders   TSH   Vitamin B12   VITAMIN D  25 Hydroxy (Vit-D Deficiency, Fractures)   Comprehensive metabolic panel   Hemoglobin A1c     Screen for STD (sexually transmitted disease)       Relevant Orders   HIV Antibody (routine testing w rflx)   Hepatitis C antibody   RPR   GC/Chlamydia Probe Amp     Modifiable risk factors discussed with patient. Anticipatory guidance according to age provided. The following topics were also discussed: Social Determinants of Health Smoking.  Non-smoker Diet and nutrition Benefits of exercise Cancer family history review Vaccinations review and recommendations Cardiovascular risk assessment and need for blood work Mental health including depression and anxiety Fall and accident prevention  Patient Instructions  High-Fiber Eating Plan Fiber, also called dietary fiber, is found in foods such as fruits, vegetables, whole grains, and beans. A high-fiber diet can be good for your health. Your health care provider may recommend a high-fiber diet to help: Prevent trouble pooping (constipation). Lower your cholesterol. Treat the following conditions: Hemorrhoids. This is inflammation of veins in the anus. Inflammation of specific areas of the digestive tract. Irritable bowel syndrome (IBS). This is a problem of the large intestine, also called the colon, that sometimes causes belly pain and  bloating. Prevent overeating as part of a weight-loss plan. Lower the risk of heart disease, type 2 diabetes, and certain cancers. What are tips for following this plan? Reading food labels  Check the nutrition facts label on foods for the amount of dietary fiber. Choose foods that have 4 grams of fiber or more per serving. The recommended goals for how much fiber you should eat each day include: Males 50 years old or  younger: 30-34 g. Males over 57 years old: 28-34 g. Females 29 years old or younger: 25-28 g. Females over 50 years old: 22-25 g. Your daily fiber goal is _____________ g. Shopping Choose whole fruits and vegetables instead of processed. For example, choose apples instead of apple juice or applesauce. Choose a variety of high-fiber foods such as avocados, lentils, oats, and pinto beans. Read the nutrition facts label on foods. Check for foods with added fiber. These foods often have high sugar and salt (sodium) amounts per serving. Cooking Use whole-grain flour for baking and cooking. Cook with brown rice instead of white rice. Make meals that have a lot of beans and vegetables in them, such as chili or vegetable-based soups. Meal planning Start the day with a breakfast that is high in fiber, such as a cereal that has 5 g of fiber or more per serving. Eat breads and cereals that are made with whole-grain flour instead of refined flour or white flour. Eat brown rice, bulgur wheat, or millet instead of white rice. Use beans in place of meat in soups, salads, and pasta dishes. Be sure that half of the grains you eat each day are whole grains. General information You can get the recommended amount of dietary fiber by: Eating a variety of fruits, vegetables, grains, nuts, and beans. Taking a fiber supplement if you aren't able to eat enough fiber. It's better to get fiber through food than from a supplement. Slowly increase how much fiber you eat. If you increase the amount of  fiber you eat too quickly, you may have bloating, cramping, or gas. Drink plenty of water to help you digest fiber. Choose high-fiber snacks, such as berries, raw vegetables, nuts, and popcorn. What foods should I eat? Fruits Berries. Pears. Apples. Oranges. Avocado. Prunes and raisins. Dried figs. Vegetables Sweet potatoes. Spinach. Kale. Artichokes. Cabbage. Broccoli. Cauliflower. Green peas. Carrots. Squash. Grains Whole-grain breads. Multigrain cereal. Oats and oatmeal. Brown rice. Barley. Bulgur wheat. Millet. Quinoa. Bran muffins. Popcorn. Rye wafer crackers. Meats and other proteins Navy beans, kidney beans, and pinto beans. Soybeans. Split peas. Lentils. Nuts and seeds. Dairy Fiber-fortified yogurt. Fortified means that fiber has been added to the product. Beverages Fiber-fortified soy milk. Fiber-fortified orange juice. Other foods Fiber bars. The items listed above may not be all the foods and drinks you can have. Talk to a dietitian to learn more. What foods should I avoid? Fruits Fruit juice. Cooked, strained fruit. Vegetables Fried potatoes. Canned vegetables. Well-cooked vegetables. Grains White bread. Pasta made with refined flour. White rice. Meats and other proteins Fatty meat. Fried chicken or fried fish. Dairy Milk. Cream cheese. Sour cream. Fats and oils Butters. Beverages Soft drinks. Other foods Cakes and pastries. The items listed above may not be all the foods and drinks you should avoid. Talk to a dietitian to learn more. This information is not intended to replace advice given to you by your health care provider. Make sure you discuss any questions you have with your health care provider. Document Revised: 08/14/2022 Document Reviewed: 08/14/2022 Elsevier Patient Education  2024 [employer].  Health Maintenance, Male Adopting a healthy lifestyle and getting preventive care are important in promoting health and wellness. Ask your health care  provider about: The right schedule for you to have regular tests and exams. Things you can do on your own to prevent diseases and keep yourself healthy. What should I know about diet, weight, and exercise? Eat a healthy diet  Eat a diet that  includes plenty of vegetables, fruits, low-fat dairy products, and lean protein. Do not eat a lot of foods that are high in solid fats, added sugars, or sodium. Maintain a healthy weight Body mass index (BMI) is a measurement that can be used to identify possible weight problems. It estimates body fat based on height and weight. Your health care provider can help determine your BMI and help you achieve or maintain a healthy weight. Get regular exercise Get regular exercise. This is one of the most important things you can do for your health. Most adults should: Exercise for at least 150 minutes each week. The exercise should increase your heart rate and make you sweat (moderate-intensity exercise). Do strengthening exercises at least twice a week. This is in addition to the moderate-intensity exercise. Spend less time sitting. Even light physical activity can be beneficial. Watch cholesterol and blood lipids Have your blood tested for lipids and cholesterol at 36 years of age, then have this test every 5 years. You may need to have your cholesterol levels checked more often if: Your lipid or cholesterol levels are high. You are older than 36 years of age. You are at high risk for heart disease. What should I know about cancer screening? Many types of cancers can be detected early and may often be prevented. Depending on your health history and family history, you may need to have cancer screening at various ages. This may include screening for: Colorectal cancer. Prostate cancer. Skin cancer. Lung cancer. What should I know about heart disease, diabetes, and high blood pressure? Blood pressure and heart disease High blood pressure causes heart  disease and increases the risk of stroke. This is more likely to develop in people who have high blood pressure readings or are overweight. Talk with your health care provider about your target blood pressure readings. Have your blood pressure checked: Every 3-5 years if you are 76-23 years of age. Every year if you are 84 years old or older. If you are between the ages of 74 and 8 and are a current or former smoker, ask your health care provider if you should have a one-time screening for abdominal aortic aneurysm (AAA). Diabetes Have regular diabetes screenings. This checks your fasting blood sugar level. Have the screening done: Once every three years after age 4 if you are at a normal weight and have a low risk for diabetes. More often and at a younger age if you are overweight or have a high risk for diabetes. What should I know about preventing infection? Hepatitis B If you have a higher risk for hepatitis B, you should be screened for this virus. Talk with your health care provider to find out if you are at risk for hepatitis B infection. Hepatitis C Blood testing is recommended for: Everyone born from [birth year] through 1963. Anyone with known risk factors for hepatitis C. Sexually transmitted infections (STIs) You should be screened each year for STIs, including gonorrhea and chlamydia, if: You are sexually active and are younger than 36 years of age. You are older than 36 years of age and your health care provider tells you that you are at risk for this type of infection. Your sexual activity has changed since you were last screened, and you are at increased risk for chlamydia or gonorrhea. Ask your health care provider if you are at risk. Ask your health care provider about whether you are at high risk for HIV. Your health care provider may recommend a prescription  medicine to help prevent HIV infection. If you choose to take medicine to prevent HIV, you should first get tested for HIV.  You should then be tested every 3 months for as long as you are taking the medicine. Follow these instructions at home: Alcohol use Do not drink alcohol if your health care provider tells you not to drink. If you drink alcohol: Limit how much you have to 0-2 drinks a day. Know how much alcohol is in your drink. In the [place] one drink equals one 12 oz bottle of beer (355 mL), one 5 oz glass of wine (148 mL), or one 1 oz glass of hard liquor (44 mL). Lifestyle Do not use any products that contain nicotine or tobacco. These products include cigarettes, chewing tobacco, and vaping devices, such as e-cigarettes. If you need help quitting, ask your health care provider. Do not use street drugs. Do not share needles. Ask your health care provider for help if you need support or information about quitting drugs. General instructions Schedule regular health, dental, and eye exams. Stay current with your vaccines. Tell your health care provider if: You often feel depressed. You have ever been abused or do not feel safe at home. Summary Adopting a healthy lifestyle and getting preventive care are important in promoting health and wellness. Follow your health care provider's instructions about healthy diet, exercising, and getting tested or screened for diseases. Follow your health care provider's instructions on monitoring your cholesterol and blood pressure. This information is not intended to replace advice given to you by your health care provider. Make sure you discuss any questions you have with your health care provider. Document Revised: 10/11/2020 Document Reviewed: 10/11/2020 Elsevier Patient Education  2024 Elsevier Inc.      Maryagnes Small, MD [facility]

## 2023-06-20 NOTE — Patient Instructions (Addendum)
 High-Fiber Eating Plan Fiber, also called dietary fiber, is found in foods such as fruits, vegetables, whole grains, and beans. A high-fiber diet can be good for your health. Your health care provider may recommend a high-fiber diet to help: Prevent trouble pooping (constipation). Lower your cholesterol. Treat the following conditions: Hemorrhoids. This is inflammation of veins in the anus. Inflammation of specific areas of the digestive tract. Irritable bowel syndrome (IBS). This is a problem of the large intestine, also called the colon, that sometimes causes belly pain and bloating. Prevent overeating as part of a weight-loss plan. Lower the risk of heart disease, type 2 diabetes, and certain cancers. What are tips for following this plan? Reading food labels  Check the nutrition facts label on foods for the amount of dietary fiber. Choose foods that have 4 grams of fiber or more per serving. The recommended goals for how much fiber you should eat each day include: Males 88 years old or younger: 30-34 g. Males over 62 years old: 28-34 g. Females 58 years old or younger: 25-28 g. Females over 43 years old: 22-25 g. Your daily fiber goal is _____________ g. Shopping Choose whole fruits and vegetables instead of processed. For example, choose apples instead of apple juice or applesauce. Choose a variety of high-fiber foods such as avocados, lentils, oats, and pinto beans. Read the nutrition facts label on foods. Check for foods with added fiber. These foods often have high sugar and salt (sodium) amounts per serving. Cooking Use whole-grain flour for baking and cooking. Cook with brown rice instead of white rice. Make meals that have a lot of beans and vegetables in them, such as chili or vegetable-based soups. Meal planning Start the day with a breakfast that is high in fiber, such as a cereal that has 5 g of fiber or more per serving. Eat breads and cereals that are made with  whole-grain flour instead of refined flour or white flour. Eat brown rice, bulgur wheat, or millet instead of white rice. Use beans in place of meat in soups, salads, and pasta dishes. Be sure that half of the grains you eat each day are whole grains. General information You can get the recommended amount of dietary fiber by: Eating a variety of fruits, vegetables, grains, nuts, and beans. Taking a fiber supplement if you aren't able to eat enough fiber. It's better to get fiber through food than from a supplement. Slowly increase how much fiber you eat. If you increase the amount of fiber you eat too quickly, you may have bloating, cramping, or gas. Drink plenty of water to help you digest fiber. Choose high-fiber snacks, such as berries, raw vegetables, nuts, and popcorn. What foods should I eat? Fruits Berries. Pears. Apples. Oranges. Avocado. Prunes and raisins. Dried figs. Vegetables Sweet potatoes. Spinach. Kale. Artichokes. Cabbage. Broccoli. Cauliflower. Green peas. Carrots. Squash. Grains Whole-grain breads. Multigrain cereal. Oats and oatmeal. Brown rice. Barley. Bulgur wheat. Millet. Quinoa. Bran muffins. Popcorn. Rye wafer crackers. Meats and other proteins Navy beans, kidney beans, and pinto beans. Soybeans. Split peas. Lentils. Nuts and seeds. Dairy Fiber-fortified yogurt. Fortified means that fiber has been added to the product. Beverages Fiber-fortified soy milk. Fiber-fortified orange juice. Other foods Fiber bars. The items listed above may not be all the foods and drinks you can have. Talk to a dietitian to learn more. What foods should I avoid? Fruits Fruit juice. Cooked, strained fruit. Vegetables Fried potatoes. Canned vegetables. Well-cooked vegetables. Grains White bread. Pasta made with refined  flour. White rice. Meats and other proteins Fatty meat. Fried chicken or fried fish. Dairy Milk. Cream cheese. Sour cream. Fats and  oils Butters. Beverages Soft drinks. Other foods Cakes and pastries. The items listed above may not be all the foods and drinks you should avoid. Talk to a dietitian to learn more. This information is not intended to replace advice given to you by your health care provider. Make sure you discuss any questions you have with your health care provider. Document Revised: 08/14/2022 Document Reviewed: 08/14/2022 Elsevier Patient Education  2024 [employer] Inc.  Health Maintenance, Male Adopting a healthy lifestyle and getting preventive care are important in promoting health and wellness. Ask your health care provider about: The right schedule for you to have regular tests and exams. Things you can do on your own to prevent diseases and keep yourself healthy. What should I know about diet, weight, and exercise? Eat a healthy diet  Eat a diet that includes plenty of vegetables, fruits, low-fat dairy products, and lean protein. Do not eat a lot of foods that are high in solid fats, added sugars, or sodium. Maintain a healthy weight Body mass index (BMI) is a measurement that can be used to identify possible weight problems. It estimates body fat based on height and weight. Your health care provider can help determine your BMI and help you achieve or maintain a healthy weight. Get regular exercise Get regular exercise. This is one of the most important things you can do for your health. Most adults should: Exercise for at least 150 minutes each week. The exercise should increase your heart rate and make you sweat (moderate-intensity exercise). Do strengthening exercises at least twice a week. This is in addition to the moderate-intensity exercise. Spend less time sitting. Even light physical activity can be beneficial. Watch cholesterol and blood lipids Have your blood tested for lipids and cholesterol at 36 years of age, then have this test every 5 years. You may need to have your cholesterol  levels checked more often if: Your lipid or cholesterol levels are high. You are older than 36 years of age. You are at high risk for heart disease. What should I know about cancer screening? Many types of cancers can be detected early and may often be prevented. Depending on your health history and family history, you may need to have cancer screening at various ages. This may include screening for: Colorectal cancer. Prostate cancer. Skin cancer. Lung cancer. What should I know about heart disease, diabetes, and high blood pressure? Blood pressure and heart disease High blood pressure causes heart disease and increases the risk of stroke. This is more likely to develop in people who have high blood pressure readings or are overweight. Talk with your health care provider about your target blood pressure readings. Have your blood pressure checked: Every 3-5 years if you are 68-50 years of age. Every year if you are 21 years old or older. If you are between the ages of 61 and 71 and are a current or former smoker, ask your health care provider if you should have a one-time screening for abdominal aortic aneurysm (AAA). Diabetes Have regular diabetes screenings. This checks your fasting blood sugar level. Have the screening done: Once every three years after age 22 if you are at a normal weight and have a low risk for diabetes. More often and at a younger age if you are overweight or have a high risk for diabetes. What should I know about  preventing infection? Hepatitis B If you have a higher risk for hepatitis B, you should be screened for this virus. Talk with your health care provider to find out if you are at risk for hepatitis B infection. Hepatitis C Blood testing is recommended for: Everyone born from [birth year] through 1965. Anyone with known risk factors for hepatitis C. Sexually transmitted infections (STIs) You should be screened each year for STIs, including gonorrhea and chlamydia,  if: You are sexually active and are younger than 36 years of age. You are older than 36 years of age and your health care provider tells you that you are at risk for this type of infection. Your sexual activity has changed since you were last screened, and you are at increased risk for chlamydia or gonorrhea. Ask your health care provider if you are at risk. Ask your health care provider about whether you are at high risk for HIV. Your health care provider may recommend a prescription medicine to help prevent HIV infection. If you choose to take medicine to prevent HIV, you should first get tested for HIV. You should then be tested every 3 months for as long as you are taking the medicine. Follow these instructions at home: Alcohol use Do not drink alcohol if your health care provider tells you not to drink. If you drink alcohol: Limit how much you have to 0-2 drinks a day. Know how much alcohol is in your drink. In the [place] one drink equals one 12 oz bottle of beer (355 mL), one 5 oz glass of wine (148 mL), or one 1 oz glass of hard liquor (44 mL). Lifestyle Do not use any products that contain nicotine or tobacco. These products include cigarettes, chewing tobacco, and vaping devices, such as e-cigarettes. If you need help quitting, ask your health care provider. Do not use street drugs. Do not share needles. Ask your health care provider for help if you need support or information about quitting drugs. General instructions Schedule regular health, dental, and eye exams. Stay current with your vaccines. Tell your health care provider if: You often feel depressed. You have ever been abused or do not feel safe at home. Summary Adopting a healthy lifestyle and getting preventive care are important in promoting health and wellness. Follow your health care provider's instructions about healthy diet, exercising, and getting tested or screened for diseases. Follow your health care provider's  instructions on monitoring your cholesterol and blood pressure. This information is not intended to replace advice given to you by your health care provider. Make sure you discuss any questions you have with your health care provider. Document Revised: 10/11/2020 Document Reviewed: 10/11/2020 Elsevier Patient Education  2024 [employer].

## 2023-06-21 LAB — HEPATITIS C ANTIBODY: Hepatitis C Ab: NONREACTIVE

## 2023-06-21 LAB — HIV ANTIBODY (ROUTINE TESTING W REFLEX): HIV 1&2 Ab, 4th Generation: NONREACTIVE

## 2023-06-21 LAB — RPR: RPR Ser Ql: NONREACTIVE

## 2023-06-24 LAB — GC/CHLAMYDIA PROBE AMP
Chlamydia trachomatis, NAA: NEGATIVE
Neisseria Gonorrhoeae by PCR: NEGATIVE

## 2023-08-23 DIAGNOSIS — F331 Major depressive disorder, recurrent, moderate: Secondary | ICD-10-CM | POA: Diagnosis not present

## 2023-09-12 DIAGNOSIS — F4310 Post-traumatic stress disorder, unspecified: Secondary | ICD-10-CM | POA: Diagnosis not present

## 2023-09-24 DIAGNOSIS — F4310 Post-traumatic stress disorder, unspecified: Secondary | ICD-10-CM | POA: Diagnosis not present

## 2023-09-24 DIAGNOSIS — F331 Major depressive disorder, recurrent, moderate: Secondary | ICD-10-CM | POA: Diagnosis not present

## 2023-09-26 ENCOUNTER — Ambulatory Visit: Admitting: Emergency Medicine

## 2023-10-03 ENCOUNTER — Telehealth: Payer: Self-pay | Admitting: Emergency Medicine

## 2023-10-03 ENCOUNTER — Ambulatory Visit (INDEPENDENT_AMBULATORY_CARE_PROVIDER_SITE_OTHER): Admitting: Emergency Medicine

## 2023-10-03 VITALS — BP 118/70 | HR 94 | Temp 98.7°F | Ht 68.5 in | Wt 192.0 lb

## 2023-10-03 DIAGNOSIS — Z113 Encounter for screening for infections with a predominantly sexual mode of transmission: Secondary | ICD-10-CM | POA: Insufficient documentation

## 2023-10-03 DIAGNOSIS — R4582 Worries: Secondary | ICD-10-CM | POA: Insufficient documentation

## 2023-10-03 LAB — CBC WITH DIFFERENTIAL/PLATELET
Basophils Absolute: 0 10*3/uL (ref 0.0–0.1)
Basophils Relative: 0.6 % (ref 0.0–3.0)
Eosinophils Absolute: 0.1 10*3/uL (ref 0.0–0.7)
Eosinophils Relative: 1.3 % (ref 0.0–5.0)
HCT: 45.6 % (ref 39.0–52.0)
Hemoglobin: 15.2 g/dL (ref 13.0–17.0)
Lymphocytes Relative: 24.5 % (ref 12.0–46.0)
Lymphs Abs: 1.8 10*3/uL (ref 0.7–4.0)
MCHC: 33.3 g/dL (ref 30.0–36.0)
MCV: 84.2 fl (ref 78.0–100.0)
Monocytes Absolute: 0.4 10*3/uL (ref 0.1–1.0)
Monocytes Relative: 4.7 % (ref 3.0–12.0)
Neutro Abs: 5.2 10*3/uL (ref 1.4–7.7)
Neutrophils Relative %: 68.9 % (ref 43.0–77.0)
Platelets: 207 10*3/uL (ref 150.0–400.0)
RBC: 5.42 Mil/uL (ref 4.22–5.81)
RDW: 12.7 % (ref 11.5–15.5)
WBC: 7.5 10*3/uL (ref 4.0–10.5)

## 2023-10-03 LAB — COMPREHENSIVE METABOLIC PANEL WITH GFR
ALT: 17 U/L (ref 0–53)
AST: 24 U/L (ref 0–37)
Albumin: 4.6 g/dL (ref 3.5–5.2)
Alkaline Phosphatase: 72 U/L (ref 39–117)
BUN: 14 mg/dL (ref 6–23)
CO2: 28 meq/L (ref 19–32)
Calcium: 9.5 mg/dL (ref 8.4–10.5)
Chloride: 103 meq/L (ref 96–112)
Creatinine, Ser: 0.99 mg/dL (ref 0.40–1.50)
GFR: 98.52 mL/min (ref >60.00)
Glucose, Bld: 98 mg/dL (ref 70–99)
Potassium: 4 meq/L (ref 3.5–5.1)
Sodium: 137 meq/L (ref 135–145)
Total Bilirubin: 0.5 mg/dL (ref 0.2–1.2)
Total Protein: 7.9 g/dL (ref 6.0–8.3)

## 2023-10-03 NOTE — Progress Notes (Signed)
 Ruthe Coward 38 y.o.   Chief Complaint  Patient presents with   Exposure to STD    Patient here to have STD and HIV testing, he states he did not have not exposure     HISTORY OF PRESENT ILLNESS: This is a 38 y.o. male sexually active worried about possible exposure Requesting screening Asymptomatic No other complaints or medical concerns today.  Exposure to STD Pertinent negatives include no abdominal pain, chest pain, chills, coughing, diarrhea, dysuria, fever, headaches, nausea, rash, shortness of breath, sore throat or vomiting.     Prior to Admission medications   Not on File    No Known Allergies  There are no active problems to display for this patient.   Past Medical History:  Diagnosis Date   High cholesterol     Past Surgical History:  Procedure Laterality Date   URETHRA SURGERY      Social History   Socioeconomic History   Marital status: Single    Spouse name: Not on file   Number of children: Not on file   Years of education: Not on file   Highest education level: Some college, no degree  Occupational History   Not on file  Tobacco Use   Smoking status: Never   Smokeless tobacco: Never  Vaping Use   Vaping status: Never Used  Substance and Sexual Activity   Alcohol use: Yes    Comment: occasional   Drug use: Not Currently    Types: Marijuana   Sexual activity: Not on file  Other Topics Concern   Not on file  Social History Narrative   Not on file   Social Drivers of Health   Financial Resource Strain: Medium Risk (10/03/2023)   Overall Financial Resource Strain (CARDIA)    Difficulty of Paying Living Expenses: Somewhat hard  Food Insecurity: Food Insecurity Present (10/03/2023)   Hunger Vital Sign    Worried About Running Out of Food in the Last Year: Sometimes true    Ran Out of Food in the Last Year: Sometimes true  Transportation Needs: No Transportation Needs (10/03/2023)   PRAPARE - Scientist, research (physical sciences) (Medical): No    Lack of Transportation (Non-Medical): No  Physical Activity: Insufficiently Active (10/03/2023)   Exercise Vital Sign    Days of Exercise per Week: 2 days    Minutes of Exercise per Session: 20 min  Stress: Stress Concern Present (10/03/2023)   [employer] of Occupational Health - Occupational Stress Questionnaire    Feeling of Stress : Rather much  Social Connections: Socially Isolated (10/03/2023)   Social Connection and Isolation Panel [NHANES]    Frequency of Communication with Friends and Family: Once a week    Frequency of Social Gatherings with Friends and Family: Never    Attends Religious Services: Never    Database administrator or Organizations: No    Attends Engineer, structural: Not on file    Marital Status: Never married  Catering manager Violence: Not on file    Family History  Problem Relation Age of Onset   Thyroid disease Mother    Cancer Sister      Review of Systems  Constitutional: Negative.  Negative for chills and fever.  HENT: Negative.  Negative for congestion and sore throat.   Respiratory: Negative.  Negative for cough and shortness of breath.   Cardiovascular: Negative.  Negative for chest pain and palpitations.  Gastrointestinal:  Negative for abdominal pain, diarrhea, nausea and  vomiting.  Genitourinary: Negative.  Negative for dysuria and hematuria.  Skin: Negative.  Negative for rash.  Neurological:  Negative for dizziness and headaches.  All other systems reviewed and are negative.   Vitals:   10/03/23 1438  BP: 118/70  Pulse: 94  Temp: 98.7 F (37.1 C)  SpO2: 96%    Physical Exam Vitals reviewed.  Constitutional:      Appearance: Normal appearance.  HENT:     Head: Normocephalic.  Eyes:     Extraocular Movements: Extraocular movements intact.  Cardiovascular:     Rate and Rhythm: Normal rate.  Pulmonary:     Effort: Pulmonary effort is normal.  Skin:    General: Skin is warm and  dry.  Neurological:     Mental Status: He is alert and oriented to person, place, and time.  Psychiatric:        Mood and Affect: Mood normal.        Behavior: Behavior normal.      ASSESSMENT & PLAN: A total of 32 minutes was spent with the patient and counseling/coordination of care regarding preparing for this visit, review of most recent office visit notes, STD differential diagnosis and need for blood work and urine testing, STD precautions, education and nutrition, review of health maintenance items, prognosis, documentation and need for follow-up.  Problem List Items Addressed This Visit       Other   Worries - Primary   Clinically stable and asymptomatic. Possible exposure Blood work and urine testing done today Advised to monitor for symptoms and contact the office if he develops any of them.      Relevant Orders   CBC with Differential/Platelet   Comprehensive metabolic panel with GFR   Hepatitis C antibody   HIV Antibody (routine testing w rflx)   RPR   GC/Chlamydia Probe Amp   Screening examination for STD (sexually transmitted disease)   Asymptomatic at present time Recommend blood work and urine testing      Relevant Orders   CBC with Differential/Platelet   Comprehensive metabolic panel with GFR   Hepatitis C antibody   HIV Antibody (routine testing w rflx)   RPR   GC/Chlamydia Probe Amp   Patient Instructions  Health Maintenance, Male Adopting a healthy lifestyle and getting preventive care are important in promoting health and wellness. Ask your health care provider about: The right schedule for you to have regular tests and exams. Things you can do on your own to prevent diseases and keep yourself healthy. What should I know about diet, weight, and exercise? Eat a healthy diet  Eat a diet that includes plenty of vegetables, fruits, low-fat dairy products, and lean protein. Do not eat a lot of foods that are high in solid fats, added sugars, or  sodium. Maintain a healthy weight Body mass index (BMI) is a measurement that can be used to identify possible weight problems. It estimates body fat based on height and weight. Your health care provider can help determine your BMI and help you achieve or maintain a healthy weight. Get regular exercise Get regular exercise. This is one of the most important things you can do for your health. Most adults should: Exercise for at least 150 minutes each week. The exercise should increase your heart rate and make you sweat (moderate-intensity exercise). Do strengthening exercises at least twice a week. This is in addition to the moderate-intensity exercise. Spend less time sitting. Even light physical activity can be beneficial. Watch cholesterol and  blood lipids Have your blood tested for lipids and cholesterol at 36 years of age, then have this test every 5 years. You may need to have your cholesterol levels checked more often if: Your lipid or cholesterol levels are high. You are older than 36 years of age. You are at high risk for heart disease. What should I know about cancer screening? Many types of cancers can be detected early and may often be prevented. Depending on your health history and family history, you may need to have cancer screening at various ages. This may include screening for: Colorectal cancer. Prostate cancer. Skin cancer. Lung cancer. What should I know about heart disease, diabetes, and high blood pressure? Blood pressure and heart disease High blood pressure causes heart disease and increases the risk of stroke. This is more likely to develop in people who have high blood pressure readings or are overweight. Talk with your health care provider about your target blood pressure readings. Have your blood pressure checked: Every 3-5 years if you are 27-35 years of age. Every year if you are 45 years old or older. If you are between the ages of 25 and 24 and are a current  or former smoker, ask your health care provider if you should have a one-time screening for abdominal aortic aneurysm (AAA). Diabetes Have regular diabetes screenings. This checks your fasting blood sugar level. Have the screening done: Once every three years after age 92 if you are at a normal weight and have a low risk for diabetes. More often and at a younger age if you are overweight or have a high risk for diabetes. What should I know about preventing infection? Hepatitis B If you have a higher risk for hepatitis B, you should be screened for this virus. Talk with your health care provider to find out if you are at risk for hepatitis B infection. Hepatitis C Blood testing is recommended for: Everyone born from [birth year] through 1963. Anyone with known risk factors for hepatitis C. Sexually transmitted infections (STIs) You should be screened each year for STIs, including gonorrhea and chlamydia, if: You are sexually active and are younger than 36 years of age. You are older than 36 years of age and your health care provider tells you that you are at risk for this type of infection. Your sexual activity has changed since you were last screened, and you are at increased risk for chlamydia or gonorrhea. Ask your health care provider if you are at risk. Ask your health care provider about whether you are at high risk for HIV. Your health care provider may recommend a prescription medicine to help prevent HIV infection. If you choose to take medicine to prevent HIV, you should first get tested for HIV. You should then be tested every 3 months for as long as you are taking the medicine. Follow these instructions at home: Alcohol use Do not drink alcohol if your health care provider tells you not to drink. If you drink alcohol: Limit how much you have to 0-2 drinks a day. Know how much alcohol is in your drink. In the [place] one drink equals one 12 oz bottle of beer (355 mL), one 5 oz glass of wine  (148 mL), or one 1 oz glass of hard liquor (44 mL). Lifestyle Do not use any products that contain nicotine or tobacco. These products include cigarettes, chewing tobacco, and vaping devices, such as e-cigarettes. If you need help quitting, ask your health care provider. Do  not use street drugs. Do not share needles. Ask your health care provider for help if you need support or information about quitting drugs. General instructions Schedule regular health, dental, and eye exams. Stay current with your vaccines. Tell your health care provider if: You often feel depressed. You have ever been abused or do not feel safe at home. Summary Adopting a healthy lifestyle and getting preventive care are important in promoting health and wellness. Follow your health care provider's instructions about healthy diet, exercising, and getting tested or screened for diseases. Follow your health care provider's instructions on monitoring your cholesterol and blood pressure. This information is not intended to replace advice given to you by your health care provider. Make sure you discuss any questions you have with your health care provider. Document Revised: 10/11/2020 Document Reviewed: 10/11/2020 Elsevier Patient Education  2024 Elsevier Inc.     Maryagnes Small, MD [facility]

## 2023-10-03 NOTE — Telephone Encounter (Signed)
 Patient would like to know if Dr. Sagardia has any dermatology recommendations for his face. Patient would like a call back at [phone].

## 2023-10-03 NOTE — Assessment & Plan Note (Signed)
 Asymptomatic at present time Recommend blood work and urine testing

## 2023-10-03 NOTE — Assessment & Plan Note (Signed)
 Clinically stable and asymptomatic. Possible exposure Blood work and urine testing done today Advised to monitor for symptoms and contact the office if he develops any of them.

## 2023-10-03 NOTE — Patient Instructions (Signed)
 Health Maintenance, Male  Adopting a healthy lifestyle and getting preventive care are important in promoting health and wellness. Ask your health care provider about:  The right schedule for you to have regular tests and exams.  Things you can do on your own to prevent diseases and keep yourself healthy.  What should I know about diet, weight, and exercise?  Eat a healthy diet    Eat a diet that includes plenty of vegetables, fruits, low-fat dairy products, and lean protein.  Do not eat a lot of foods that are high in solid fats, added sugars, or sodium.  Maintain a healthy weight  Body mass index (BMI) is a measurement that can be used to identify possible weight problems. It estimates body fat based on height and weight. Your health care provider can help determine your BMI and help you achieve or maintain a healthy weight.  Get regular exercise  Get regular exercise. This is one of the most important things you can do for your health. Most adults should:  Exercise for at least 150 minutes each week. The exercise should increase your heart rate and make you sweat (moderate-intensity exercise).  Do strengthening exercises at least twice a week. This is in addition to the moderate-intensity exercise.  Spend less time sitting. Even light physical activity can be beneficial.  Watch cholesterol and blood lipids  Have your blood tested for lipids and cholesterol at 36 years of age, then have this test every 5 years.  You may need to have your cholesterol levels checked more often if:  Your lipid or cholesterol levels are high.  You are older than 36 years of age.  You are at high risk for heart disease.  What should I know about cancer screening?  Many types of cancers can be detected early and may often be prevented. Depending on your health history and family history, you may need to have cancer screening at various ages. This may include screening for:  Colorectal cancer.  Prostate cancer.  Skin cancer.  Lung  cancer.  What should I know about heart disease, diabetes, and high blood pressure?  Blood pressure and heart disease  High blood pressure causes heart disease and increases the risk of stroke. This is more likely to develop in people who have high blood pressure readings or are overweight.  Talk with your health care provider about your target blood pressure readings.  Have your blood pressure checked:  Every 3-5 years if you are 9-95 years of age.  Every year if you are 85 years old or older.  If you are between the ages of 29 and 29 and are a current or former smoker, ask your health care provider if you should have a one-time screening for abdominal aortic aneurysm (AAA).  Diabetes  Have regular diabetes screenings. This checks your fasting blood sugar level. Have the screening done:  Once every three years after age 23 if you are at a normal weight and have a low risk for diabetes.  More often and at a younger age if you are overweight or have a high risk for diabetes.  What should I know about preventing infection?  Hepatitis B  If you have a higher risk for hepatitis B, you should be screened for this virus. Talk with your health care provider to find out if you are at risk for hepatitis B infection.  Hepatitis C  Blood testing is recommended for:  Everyone born from [birth year] through 1965.  Anyone  with known risk factors for hepatitis C.  Sexually transmitted infections (STIs)  You should be screened each year for STIs, including gonorrhea and chlamydia, if:  You are sexually active and are younger than 36 years of age.  You are older than 36 years of age and your health care provider tells you that you are at risk for this type of infection.  Your sexual activity has changed since you were last screened, and you are at increased risk for chlamydia or gonorrhea. Ask your health care provider if you are at risk.  Ask your health care provider about whether you are at high risk for HIV. Your health care provider  may recommend a prescription medicine to help prevent HIV infection. If you choose to take medicine to prevent HIV, you should first get tested for HIV. You should then be tested every 3 months for as long as you are taking the medicine.  Follow these instructions at home:  Alcohol use  Do not drink alcohol if your health care provider tells you not to drink.  If you drink alcohol:  Limit how much you have to 0-2 drinks a day.  Know how much alcohol is in your drink. In the [place] one drink equals one 12 oz bottle of beer (355 mL), one 5 oz glass of wine (148 mL), or one 1 oz glass of hard liquor (44 mL).  Lifestyle  Do not use any products that contain nicotine or tobacco. These products include cigarettes, chewing tobacco, and vaping devices, such as e-cigarettes. If you need help quitting, ask your health care provider.  Do not use street drugs.  Do not share needles.  Ask your health care provider for help if you need support or information about quitting drugs.  General instructions  Schedule regular health, dental, and eye exams.  Stay current with your vaccines.  Tell your health care provider if:  You often feel depressed.  You have ever been abused or do not feel safe at home.  Summary  Adopting a healthy lifestyle and getting preventive care are important in promoting health and wellness.  Follow your health care provider's instructions about healthy diet, exercising, and getting tested or screened for diseases.  Follow your health care provider's instructions on monitoring your cholesterol and blood pressure.  This information is not intended to replace advice given to you by your health care provider. Make sure you discuss any questions you have with your health care provider.  Document Revised: 10/11/2020 Document Reviewed: 10/11/2020  Elsevier Patient Education  2024 [employer].

## 2023-10-04 ENCOUNTER — Encounter: Payer: Self-pay | Admitting: Radiology

## 2023-10-04 ENCOUNTER — Other Ambulatory Visit: Payer: Self-pay | Admitting: Radiology

## 2023-10-04 DIAGNOSIS — L709 Acne, unspecified: Secondary | ICD-10-CM

## 2023-10-04 DIAGNOSIS — F4310 Post-traumatic stress disorder, unspecified: Secondary | ICD-10-CM | POA: Diagnosis not present

## 2023-10-04 DIAGNOSIS — F331 Major depressive disorder, recurrent, moderate: Secondary | ICD-10-CM | POA: Diagnosis not present

## 2023-10-04 LAB — HIV ANTIBODY (ROUTINE TESTING W REFLEX): HIV 1&2 Ab, 4th Generation: NONREACTIVE

## 2023-10-04 LAB — RPR: RPR Ser Ql: NONREACTIVE

## 2023-10-04 LAB — HEPATITIS C ANTIBODY: Hepatitis C Ab: NONREACTIVE

## 2023-10-04 NOTE — Telephone Encounter (Signed)
 Refer to dermatology as requested.  I do not have any specific recommendations.

## 2023-10-05 ENCOUNTER — Encounter: Payer: Self-pay | Admitting: Emergency Medicine

## 2023-10-06 LAB — GC/CHLAMYDIA PROBE AMP
Chlamydia trachomatis, NAA: NEGATIVE
Neisseria Gonorrhoeae by PCR: NEGATIVE

## 2023-11-29 ENCOUNTER — Encounter: Payer: Self-pay | Admitting: Emergency Medicine

## 2023-11-30 NOTE — Telephone Encounter (Signed)
 He had annual exam last January.  No need for full body scan in a healthy 38 year old male without clear indications.

## 2023-12-18 DIAGNOSIS — F4310 Post-traumatic stress disorder, unspecified: Secondary | ICD-10-CM | POA: Diagnosis not present

## 2023-12-18 DIAGNOSIS — F331 Major depressive disorder, recurrent, moderate: Secondary | ICD-10-CM | POA: Diagnosis not present

## 2024-01-15 ENCOUNTER — Ambulatory Visit (INDEPENDENT_AMBULATORY_CARE_PROVIDER_SITE_OTHER)

## 2024-01-15 ENCOUNTER — Ambulatory Visit: Admitting: Internal Medicine

## 2024-01-15 ENCOUNTER — Encounter: Payer: Self-pay | Admitting: Internal Medicine

## 2024-01-15 VITALS — BP 106/68 | HR 77 | Temp 98.6°F | Ht 68.5 in | Wt 192.0 lb

## 2024-01-15 DIAGNOSIS — M79644 Pain in right finger(s): Secondary | ICD-10-CM | POA: Insufficient documentation

## 2024-01-15 MED ORDER — MELOXICAM 15 MG PO TABS: 15.0000 mg | ORAL_TABLET | Freq: Every day | ORAL | 1 refills | Status: AC | PRN

## 2024-01-15 NOTE — Assessment & Plan Note (Signed)
 Pt with unfortunate water park accidental slip and fell with body wt on the thumb, now with persistent pain and swelling - now for xray r/o fracture, mobic  15 mg prn, voltaren gel prn, and refer Sports Medicine

## 2024-01-15 NOTE — Patient Instructions (Addendum)
 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  Otherwise, you will receive a letter about your results with an explanation, but please check with MyChart first.

## 2024-01-15 NOTE — Progress Notes (Signed)
 Patient ID: Evan Randall, male   DOB: 09/22/1985, 38 y.o.   MRN: [id number]        Chief Complaint: follow up right thumb pain       HPI:  Evan Randall is a 38 y.o. male here with c/o persistent moderate to severe swelling of the right thumb CMC ongoing and not improving since a mishap at the water park where the hand slipped and he fell directly on the thumb with immediate pain   Pt is concerned about fracture.  No other hand pain thumb or hand swelling.  Pt denies chest pain, increased sob or doe, wheezing, orthopnea, PND, increased LE swelling, palpitations, dizziness or syncope.   Pt denies polydipsia, polyuria, or new focal neuro s/s.        Wt Readings from Last 3 Encounters:  01/15/24 192 lb (87.1 kg)  10/03/23 192 lb (87.1 kg)  06/20/23 194 lb (88 kg)   BP Readings from Last 3 Encounters:  01/15/24 106/68  10/03/23 118/70  06/20/23 112/70         Past Medical History:  Diagnosis Date   High cholesterol    Past Surgical History:  Procedure Laterality Date   URETHRA SURGERY      reports that he has never smoked. He has never used smokeless tobacco. He reports current alcohol use. He reports that he does not currently use drugs after having used the following drugs: Marijuana. family history includes Cancer in his sister; Thyroid disease in his mother. No Known Allergies No current outpatient medications on file prior to visit.   No current facility-administered medications on file prior to visit.        ROS:  All others reviewed and negative.  Objective        PE:  BP 106/68   Pulse 77   Temp 98.6 F (37 C)   Ht 5' 8.5 (1.74 m)   Wt 192 lb (87.1 kg)   SpO2 97%   BMI 28.77 kg/m                 Constitutional: Pt appears in NAD               HENT: Head: NCAT.                Right Ear: External ear normal.                 Left Ear: External ear normal.                Eyes: . Pupils are equal, round, and reactive to light. Conjunctivae and EOM are normal                Nose: without d/c or deformity               Neck: Neck supple. Gross normal ROM               Cardiovascular: Normal rate and regular rhythm.                 Pulmonary/Chest: Effort normal and breath sounds without rales or wheezing.                Right thumb cmc with 1-2+ tender swelling o/w neurovasc intact               Neurological: Pt is alert. At baseline orientation, motor grossly intact  Skin: Skin is warm. No rashes, no other new lesions, LE edema - none               Psychiatric: Pt behavior is normal without agitation   Micro: none  Cardiac tracings I have personally interpreted today:  none  Pertinent Radiological findings (summarize): none   Lab Results  Component Value Date   WBC 7.5 10/03/2023   HGB 15.2 10/03/2023   HCT 45.6 10/03/2023   PLT 207.0 10/03/2023   GLUCOSE 98 10/03/2023   CHOL 205 (H) 06/20/2023   TRIG 85.0 06/20/2023   HDL 51.60 06/20/2023   LDLCALC 137 (H) 06/20/2023   ALT 17 10/03/2023   AST 24 10/03/2023   NA 137 10/03/2023   K 4.0 10/03/2023   CL 103 10/03/2023   CREATININE 0.99 10/03/2023   BUN 14 10/03/2023   CO2 28 10/03/2023   TSH 1.16 06/20/2023   HGBA1C 5.9 06/20/2023   Assessment/Plan:  Evan Randall is a 39 y.o. Black or African American [2] male with  has a past medical history of High cholesterol.  Pain of right thumb Pt with unfortunate water park accidental slip and fell with body wt on the thumb, now with persistent pain and swelling - now for xray r/o fracture, mobic  15 mg prn, voltaren gel prn, and refer Sports Medicine  Followup: Return if symptoms worsen or fail to improve.  Lynwood Rush, MD 01/15/2024 7:16 PM [facility] - [facility]

## 2024-01-17 ENCOUNTER — Ambulatory Visit: Admitting: Emergency Medicine

## 2024-01-21 ENCOUNTER — Telehealth: Payer: Self-pay | Admitting: Emergency Medicine

## 2024-01-21 NOTE — Telephone Encounter (Signed)
 Patient wants to know if Dr. Purcell will order more blood work testing everything, STD, HIV, etc.  Please call patient and let him know.  Phone:  [phone] Please send patient a message on mychart

## 2024-01-23 ENCOUNTER — Ambulatory Visit: Admitting: Sports Medicine

## 2024-01-28 ENCOUNTER — Ambulatory Visit: Payer: Self-pay | Admitting: Internal Medicine

## 2024-01-29 ENCOUNTER — Ambulatory Visit: Admitting: Sports Medicine

## 2024-01-29 VITALS — BP 136/82 | HR 70 | Ht 68.0 in | Wt 196.0 lb

## 2024-01-29 DIAGNOSIS — M79644 Pain in right finger(s): Secondary | ICD-10-CM | POA: Diagnosis not present

## 2024-01-29 DIAGNOSIS — M9901 Segmental and somatic dysfunction of cervical region: Secondary | ICD-10-CM | POA: Diagnosis not present

## 2024-01-29 DIAGNOSIS — M9904 Segmental and somatic dysfunction of sacral region: Secondary | ICD-10-CM | POA: Diagnosis not present

## 2024-01-29 DIAGNOSIS — M9902 Segmental and somatic dysfunction of thoracic region: Secondary | ICD-10-CM | POA: Diagnosis not present

## 2024-01-29 DIAGNOSIS — S63641A Sprain of metacarpophalangeal joint of right thumb, initial encounter: Secondary | ICD-10-CM

## 2024-01-29 DIAGNOSIS — M9903 Segmental and somatic dysfunction of lumbar region: Secondary | ICD-10-CM | POA: Diagnosis not present

## 2024-01-29 MED ORDER — MELOXICAM 15 MG PO TABS: 15.0000 mg | ORAL_TABLET | Freq: Every day | ORAL | 0 refills | Status: AC

## 2024-01-29 NOTE — Progress Notes (Signed)
    Ben Britaney Espaillat D.CLEMENTEEN [facility] [address] [place] [address] Phone: [phone]   Assessment and Plan:     1. Pain of right thumb (Primary) 2. Sprain of metacarpophalangeal (MCP) joint of right thumb, initial encounter -Acute, initial visit - Consistent with jamming of first MCP versus sprain of first digit UCL.  No laxity with valgus stress of the MCP, so I do not believe patient tore UCL - Start meloxicam  15 mg daily x2 weeks.  If still having pain after 2 weeks, complete 3rd-week of NSAID. May use remaining NSAID as needed once daily for pain control.  Do not to use additional over-the-counter NSAIDs (ibuprofen, naproxen, Advil, Aleve, etc.) while taking prescription NSAIDs.  May use Tylenol [id number] mg 2 to 3 times a day for breakthrough pain. - Recommend using thumb brace to decrease strain over MCP for the next 2 to 3 weeks, gradually discontinuing brace as symptoms improve -X-ray reviewed with patient in clinic.  My interpretation: No acute fracture or dislocation  Pertinent previous records reviewed include right thumb x-ray 01/15/24, internal medicine note 01/15/24, telephone encounter 01/28/2024   Follow Up: As needed if no improvement or worsening of symptoms in 3 to 4 weeks.  Would perform ultrasound to ensure no damage to UCL and could consider first MCP CSI versus prednisone course   Subjective:   I, Evan Randall, am serving as a Neurosurgeon for Doctor Morene Mace  Chief Complaint: right  thumb pain   HPI:   01/29/24 Patient is a 38 year old male with thumb pain. Patient states right thumb pain for about 3-4 weeks. [place]. Decreased ROM with certain movements. Decreased grip strength. Meloxicam  but he hasn't taken it consistently. No radiating pain.    Relevant Historical Information: None pertinent  Additional pertinent review of systems negative.   Current Outpatient Medications:    meloxicam   (MOBIC ) 15 MG tablet, Take 1 tablet (15 mg total) by mouth daily., Disp: 30 tablet, Rfl: 0   meloxicam  (MOBIC ) 15 MG tablet, Take 1 tablet (15 mg total) by mouth daily as needed., Disp: 90 tablet, Rfl: 1   Objective:     Vitals:   01/29/24 1508  BP: 136/82  Pulse: 70  SpO2: 97%  Weight: 196 lb (88.9 kg)  Height: 5' 8 (1.727 m)      Body mass index is 29.8 kg/m.    Physical Exam:    General: Appears well, nad, nontoxic and pleasant Neuro:sensation intact, strength is 5/5 in upper extremities, muscle tone wnl Skin:no susupicious lesions or rashes  Right hand/Wrist:   No deformity Mild effusion surrounding first MCP on right without ecchymosis, open lesion, warmth Flexor and extensor mechanisms intact to first digit No pain with grind test of first MCP Ulnar-sided pain at first MCP with valgus stress testing, though no laxity nttp over the snuff box, dorsal carpals, volar carpals, radial styloid, ulnar styloid, , tfcc  Electronically signed by:  Odis Mace D.[facility]MENTE[facility] 3:37 PM 01/29/24

## 2024-01-29 NOTE — Patient Instructions (Addendum)
-   Start meloxicam  15 mg daily x2 weeks.  If still having pain after 2 weeks, complete 3rd-week of NSAID. May use remaining NSAID as needed once daily for pain control.  Do not to use additional over-the-counter NSAIDs (ibuprofen, naproxen, Advil, Aleve, etc.) while taking prescription NSAIDs.  May use Tylenol [id number] mg 2 to 3 times a day for breakthrough pain.  Thumb spica as much as possible 2-3 weeks and then gradually decrease  Out of brace at least 1-2 times a day for gentle ROM   As needed follow up 3-4 weeks if no improvements

## 2024-02-07 DIAGNOSIS — F4310 Post-traumatic stress disorder, unspecified: Secondary | ICD-10-CM | POA: Diagnosis not present

## 2024-02-07 DIAGNOSIS — F331 Major depressive disorder, recurrent, moderate: Secondary | ICD-10-CM | POA: Diagnosis not present

## 2024-02-27 ENCOUNTER — Encounter: Payer: Self-pay | Admitting: Emergency Medicine

## 2024-02-29 ENCOUNTER — Telehealth: Payer: Self-pay | Admitting: Radiology

## 2024-02-29 NOTE — Telephone Encounter (Signed)
 No indication for this.  Not recommended.

## 2024-02-29 NOTE — Telephone Encounter (Signed)
 Copied from CRM [id number]. Topic: General - Other >> Feb 29, 2024 12:38 PM Rosina BIRCH wrote: Reason for CRM: patient called checking on a mychart message and he want to see if he can get a coronary artery check done also [phone]

## 2024-02-29 NOTE — Telephone Encounter (Signed)
 Needs office visit.

## 2024-03-03 NOTE — Telephone Encounter (Signed)
 Called and notified pt

## 2024-03-13 ENCOUNTER — Ambulatory Visit: Admitting: Emergency Medicine

## 2024-03-13 VITALS — BP 114/78 | HR 67 | Temp 98.1°F | Ht 68.0 in | Wt 191.0 lb

## 2024-03-13 DIAGNOSIS — R079 Chest pain, unspecified: Secondary | ICD-10-CM

## 2024-03-13 DIAGNOSIS — E785 Hyperlipidemia, unspecified: Secondary | ICD-10-CM

## 2024-03-13 DIAGNOSIS — Z113 Encounter for screening for infections with a predominantly sexual mode of transmission: Secondary | ICD-10-CM | POA: Diagnosis not present

## 2024-03-13 NOTE — Assessment & Plan Note (Signed)
 Clinically stable.  No red flag signs or symptoms Benign physical examination Family history of heart disease Normal EKG in the recent past Chest x-ray report from 06/07/2023 reviewed.  Normal Recommend CT cardiac scoring for reassurance

## 2024-03-13 NOTE — Assessment & Plan Note (Signed)
 Diet and nutrition discussed Lipid profile repeated today Benefits of exercise discussed

## 2024-03-13 NOTE — Assessment & Plan Note (Signed)
 Asymptomatic at present time Recommend blood work and urine testing

## 2024-03-13 NOTE — Patient Instructions (Signed)
 Health Maintenance, Male  Adopting a healthy lifestyle and getting preventive care are important in promoting health and wellness. Ask your health care provider about:  The right schedule for you to have regular tests and exams.  Things you can do on your own to prevent diseases and keep yourself healthy.  What should I know about diet, weight, and exercise?  Eat a healthy diet    Eat a diet that includes plenty of vegetables, fruits, low-fat dairy products, and lean protein.  Do not eat a lot of foods that are high in solid fats, added sugars, or sodium.  Maintain a healthy weight  Body mass index (BMI) is a measurement that can be used to identify possible weight problems. It estimates body fat based on height and weight. Your health care provider can help determine your BMI and help you achieve or maintain a healthy weight.  Get regular exercise  Get regular exercise. This is one of the most important things you can do for your health. Most adults should:  Exercise for at least 150 minutes each week. The exercise should increase your heart rate and make you sweat (moderate-intensity exercise).  Do strengthening exercises at least twice a week. This is in addition to the moderate-intensity exercise.  Spend less time sitting. Even light physical activity can be beneficial.  Watch cholesterol and blood lipids  Have your blood tested for lipids and cholesterol at 36 years of age, then have this test every 5 years.  You may need to have your cholesterol levels checked more often if:  Your lipid or cholesterol levels are high.  You are older than 35 years of age.  You are at high risk for heart disease.  What should I know about cancer screening?  Many types of cancers can be detected early and may often be prevented. Depending on your health history and family history, you may need to have cancer screening at various ages. This may include screening for:  Colorectal cancer.  Prostate cancer.  Skin cancer.  Lung  cancer.  What should I know about heart disease, diabetes, and high blood pressure?  Blood pressure and heart disease  High blood pressure causes heart disease and increases the risk of stroke. This is more likely to develop in people who have high blood pressure readings or are overweight.  Talk with your health care provider about your target blood pressure readings.  Have your blood pressure checked:  Every 3-5 years if you are 36-36 years of age.  Every year if you are 36 years old or older.  If you are between the ages of 60 and 72 and are a current or former smoker, ask your health care provider if you should have a one-time screening for abdominal aortic aneurysm (AAA).  Diabetes  Have regular diabetes screenings. This checks your fasting blood sugar level. Have the screening done:  Once every three years after age 36 if you are at a normal weight and have a low risk for diabetes.  More often and at a younger age if you are overweight or have a high risk for diabetes.  What should I know about preventing infection?  Hepatitis B  If you have a higher risk for hepatitis B, you should be screened for this virus. Talk with your health care provider to find out if you are at risk for hepatitis B infection.  Hepatitis C  Blood testing is recommended for:  Everyone born from [birth year] through 1963.  Anyone  with known risk factors for hepatitis C.  Sexually transmitted infections (STIs)  You should be screened each year for STIs, including gonorrhea and chlamydia, if:  You are sexually active and are younger than 36 years of age.  You are older than 36 years of age and your health care provider tells you that you are at risk for this type of infection.  Your sexual activity has changed since you were last screened, and you are at increased risk for chlamydia or gonorrhea. Ask your health care provider if you are at risk.  Ask your health care provider about whether you are at high risk for HIV. Your health care provider  may recommend a prescription medicine to help prevent HIV infection. If you choose to take medicine to prevent HIV, you should first get tested for HIV. You should then be tested every 3 months for as long as you are taking the medicine.  Follow these instructions at home:  Alcohol use  Do not drink alcohol if your health care provider tells you not to drink.  If you drink alcohol:  Limit how much you have to 0-2 drinks a day.  Know how much alcohol is in your drink. In the [place] one drink equals one 12 oz bottle of beer (355 mL), one 5 oz glass of wine (148 mL), or one 1 oz glass of hard liquor (44 mL).  Lifestyle  Do not use any products that contain nicotine or tobacco. These products include cigarettes, chewing tobacco, and vaping devices, such as e-cigarettes. If you need help quitting, ask your health care provider.  Do not use street drugs.  Do not share needles.  Ask your health care provider for help if you need support or information about quitting drugs.  General instructions  Schedule regular health, dental, and eye exams.  Stay current with your vaccines.  Tell your health care provider if:  You often feel depressed.  You have ever been abused or do not feel safe at home.  Summary  Adopting a healthy lifestyle and getting preventive care are important in promoting health and wellness.  Follow your health care provider's instructions about healthy diet, exercising, and getting tested or screened for diseases.  Follow your health care provider's instructions on monitoring your cholesterol and blood pressure.  This information is not intended to replace advice given to you by your health care provider. Make sure you discuss any questions you have with your health care provider.  Document Revised: 10/11/2020 Document Reviewed: 10/11/2020  Elsevier Patient Education  2024 [employer].

## 2024-03-13 NOTE — Progress Notes (Signed)
 Evan Randall 38 y.o.   Chief Complaint  Patient presents with   STD testing     Patient here wanting blood work for STD, plaque in his heart, his vitamins. Patient mentions that he he's been feeling drained during the day also having chest pain that has been on and off for awhile. Also having right heel pain for about month     HISTORY OF PRESENT ILLNESS: This is a 38 y.o. male worried about STDs.  Requesting screening Had a recent sexual encounter when condom broke. History of dyslipidemia.  Concerned about coronary artery disease. No other complaint or medical concerns today.  HPI   Prior to Admission medications   Medication Sig Start Date End Date Taking? Authorizing Provider  meloxicam  (MOBIC ) 15 MG tablet Take 1 tablet (15 mg total) by mouth daily as needed. 01/15/24  Yes Evan Randall  meloxicam  (MOBIC ) 15 MG tablet Take 1 tablet (15 mg total) by mouth daily. Patient not taking: Reported on 03/13/2024 01/29/24   Evan Randall    No Known Allergies  Patient Active Problem List   Diagnosis Date Noted   Pain of right thumb 01/15/2024   Worries 10/03/2023   Screening examination for STD (sexually transmitted disease) 10/03/2023    Past Medical History:  Diagnosis Date   High cholesterol     Past Surgical History:  Procedure Laterality Date   URETHRA SURGERY      Social History   Socioeconomic History   Marital status: Single    Spouse name: Not on file   Number of children: Not on file   Years of education: Not on file   Highest education level: Associate degree: occupational, Scientist, product/process development, or vocational program  Occupational History   Not on file  Tobacco Use   Smoking status: Never   Smokeless tobacco: Never  Vaping Use   Vaping status: Never Used  Substance and Sexual Activity   Alcohol use: Yes    Comment: occasional   Drug use: Not Currently    Types: Marijuana   Sexual activity: Not on file  Other Topics Concern   Not on file   Social History Narrative   Not on file   Social Drivers of Health   Financial Resource Strain: Medium Risk (03/13/2024)   Overall Financial Resource Strain (CARDIA)    Difficulty of Paying Living Expenses: Somewhat hard  Food Insecurity: Food Insecurity Present (03/13/2024)   Hunger Vital Sign    Worried About Running Out of Food in the Last Year: Sometimes true    Ran Out of Food in the Last Year: Sometimes true  Transportation Needs: No Transportation Needs (03/13/2024)   PRAPARE - Administrator, Civil Service (Medical): No    Lack of Transportation (Non-Medical): No  Physical Activity: Insufficiently Active (10/03/2023)   Exercise Vital Sign    Days of Exercise per Week: 2 days    Minutes of Exercise per Session: 20 min  Stress: Stress Concern Present (10/03/2023)   Evan Randall [employer] Occupational Health - Occupational Stress Questionnaire    Feeling of Stress : Rather much  Social Connections: Socially Isolated (10/03/2023)   Social Connection and Isolation Panel    Frequency of Communication with Friends and Family: Once a week    Frequency of Social Gatherings with Friends and Family: Never    Attends Religious Services: Never    Database administrator or Organizations: No    Attends Banker Meetings: Not on file  Marital Status: Never married  Catering manager Violence: Not on file    Family History  Problem Relation Age of Onset   Thyroid disease Mother    Cancer Sister      Review of Systems  Constitutional: Negative.  Negative for chills and fever.  HENT: Negative.  Negative for congestion and sore throat.   Respiratory: Negative.  Negative for cough and shortness of breath.   Cardiovascular:  Positive for chest pain. Negative for palpitations.  Gastrointestinal:  Negative for abdominal pain, diarrhea, nausea and vomiting.  Genitourinary: Negative.  Negative for dysuria and hematuria.  Skin: Negative.  Negative for rash.   Neurological: Negative.  Negative for dizziness and headaches.  All other systems reviewed and are negative.   Vitals:   03/13/24 1543  BP: 114/78  Pulse: 67  Temp: 98.1 F (36.7 C)  SpO2: 96%    Physical Exam Vitals reviewed.  Constitutional:      Appearance: Normal appearance.  HENT:     Head: Normocephalic.     Mouth/Throat:     Mouth: Mucous membranes are moist.     Pharynx: Oropharynx is clear.  Eyes:     Extraocular Movements: Extraocular movements intact.     Pupils: Pupils are equal, round, and reactive to light.  Cardiovascular:     Rate and Rhythm: Normal rate and regular rhythm.     Pulses: Normal pulses.     Heart sounds: Normal heart sounds.  Pulmonary:     Effort: Pulmonary effort is normal.     Breath sounds: Normal breath sounds.  Musculoskeletal:     Cervical back: No tenderness.  Lymphadenopathy:     Cervical: No cervical adenopathy.  Skin:    General: Skin is warm and dry.     Capillary Refill: Capillary refill takes less than 2 seconds.  Neurological:     General: No focal deficit present.     Mental Status: He is alert and oriented to person, place, and time.  Psychiatric:        Mood and Affect: Mood normal.        Behavior: Behavior normal.      ASSESSMENT & PLAN: Problem List Items Addressed This Visit       Other   Nonspecific chest pain - Primary   Clinically stable.  No red flag signs or symptoms Benign physical examination Family history of heart disease Normal EKG in the recent past Chest x-ray report from 06/07/2023 reviewed.  Normal Recommend CT cardiac scoring for reassurance      Screening examination for STD (sexually transmitted disease)   Asymptomatic at present time Recommend blood work and urine testing      Dyslipidemia   Diet and nutrition discussed Lipid profile repeated today Benefits of exercise discussed      Relevant Orders   Comprehensive metabolic panel with GFR   CBC with Differential/Platelet    Lipid panel   CT CARDIAC SCORING (SELF PAY ONLY)   Other Visit Diagnoses       Screen for STD (sexually transmitted disease)       Relevant Orders   GC/Chlamydia Probe Amp   HSV(herpes simplex vrs) 1+2 ab-IgG   HIV Antibody (routine testing w rflx)   RPR   Hepatitis C antibody      Patient Instructions  Health Maintenance, Male Adopting a healthy lifestyle and getting preventive care are important in promoting health and wellness. Ask your health care provider about: The right schedule for you to have  regular tests and exams. Things you can Randall on your own to prevent diseases and keep yourself healthy. What should I know about diet, weight, and exercise? Eat a healthy diet  Eat a diet that includes plenty of vegetables, fruits, low-fat dairy products, and lean protein. Randall not eat a lot of foods that are high in solid fats, added sugars, or sodium. Maintain a healthy weight Body mass index (BMI) is a measurement that can be used to identify possible weight problems. It estimates body fat based on height and weight. Your health care provider can help determine your BMI and help you achieve or maintain a healthy weight. Get regular exercise Get regular exercise. This is one of the most important things you can Randall for your health. Most adults should: Exercise for at least 150 minutes each week. The exercise should increase your heart rate and make you sweat (moderate-intensity exercise). Randall strengthening exercises at least twice a week. This is in addition to the moderate-intensity exercise. Spend less time sitting. Even light physical activity can be beneficial. Watch cholesterol and blood lipids Have your blood tested for lipids and cholesterol at 36 years of age, then have this test every 5 years. You may need to have your cholesterol levels checked more often if: Your lipid or cholesterol levels are high. You are older than 36 years of age. You are at high risk for heart  disease. What should I know about cancer screening? Many types of cancers can be detected early and may often be prevented. Depending on your health history and family history, you may need to have cancer screening at various ages. This may include screening for: Colorectal cancer. Prostate cancer. Skin cancer. Lung cancer. What should I know about heart disease, diabetes, and high blood pressure? Blood pressure and heart disease High blood pressure causes heart disease and increases the risk of stroke. This is more likely to develop in people who have high blood pressure readings or are overweight. Talk with your health care provider about your target blood pressure readings. Have your blood pressure checked: Every 3-5 years if you are 38-66 years of age. Every year if you are 76 years old or older. If you are between the ages of 39 and 76 and are a current or former smoker, ask your health care provider if you should have a one-time screening for abdominal aortic aneurysm (AAA). Diabetes Have regular diabetes screenings. This checks your fasting blood sugar level. Have the screening done: Once every three years after age 70 if you are at a normal weight and have a low risk for diabetes. More often and at a younger age if you are overweight or have a high risk for diabetes. What should I know about preventing infection? Hepatitis B If you have a higher risk for hepatitis B, you should be screened for this virus. Talk with your health care provider to find out if you are at risk for hepatitis B infection. Hepatitis C Blood testing is recommended for: Everyone born from [birth year] through 1963. Anyone with known risk factors for hepatitis C. Sexually transmitted infections (STIs) You should be screened each year for STIs, including gonorrhea and chlamydia, if: You are sexually active and are younger than 36 years of age. You are older than 36 years of age and your health care provider tells you  that you are at risk for this type of infection. Your sexual activity has changed since you were last screened, and you are at increased  risk for chlamydia or gonorrhea. Ask your health care provider if you are at risk. Ask your health care provider about whether you are at high risk for HIV. Your health care provider may recommend a prescription medicine to help prevent HIV infection. If you choose to take medicine to prevent HIV, you should first get tested for HIV. You should then be tested every 3 months for as long as you are taking the medicine. Follow these instructions at home: Alcohol use Randall not drink alcohol if your health care provider tells you not to drink. If you drink alcohol: Limit how much you have to 0-2 drinks a day. Know how much alcohol is in your drink. In the [place] one drink equals one 12 oz bottle of beer (355 mL), one 5 oz glass of wine (148 mL), or one 1 oz glass of hard liquor (44 mL). Lifestyle Randall not use any products that contain nicotine or tobacco. These products include cigarettes, chewing tobacco, and vaping devices, such as e-cigarettes. If you need help quitting, ask your health care provider. Randall not use street drugs. Randall not share needles. Ask your health care provider for help if you need support or information about quitting drugs. General instructions Schedule regular health, dental, and eye exams. Stay current with your vaccines. Tell your health care provider if: You often feel depressed. You have ever been abused or Randall not feel safe at home. Summary Adopting a healthy lifestyle and getting preventive care are important in promoting health and wellness. Follow your health care provider's instructions about healthy diet, exercising, and getting tested or screened for diseases. Follow your health care provider's instructions on monitoring your cholesterol and blood pressure. This information is not intended to replace advice given to you by your health  care provider. Make sure you discuss any questions you have with your health care provider. Document Revised: 10/11/2020 Document Reviewed: 10/11/2020 Elsevier Patient Education  2024 Elsevier Inc.     Emil Schaumann, [facility]

## 2024-03-14 ENCOUNTER — Ambulatory Visit: Payer: Self-pay | Admitting: Emergency Medicine

## 2024-03-14 LAB — COMPREHENSIVE METABOLIC PANEL WITH GFR
ALT: 27 U/L (ref 0–53)
AST: 36 U/L (ref 0–37)
Albumin: 4.5 g/dL (ref 3.5–5.2)
Alkaline Phosphatase: 59 U/L (ref 39–117)
BUN: 18 mg/dL (ref 6–23)
CO2: 25 meq/L (ref 19–32)
Calcium: 9.2 mg/dL (ref 8.4–10.5)
Chloride: 104 meq/L (ref 96–112)
Creatinine, Ser: 0.92 mg/dL (ref 0.40–1.50)
GFR: 107.24 mL/min (ref >60.00)
Glucose, Bld: 92 mg/dL (ref 70–99)
Potassium: 4 meq/L (ref 3.5–5.1)
Sodium: 136 meq/L (ref 135–145)
Total Bilirubin: 0.4 mg/dL (ref 0.2–1.2)
Total Protein: 7.4 g/dL (ref 6.0–8.3)

## 2024-03-14 LAB — LIPID PANEL
Cholesterol: 183 mg/dL (ref 0–200)
HDL: 50 mg/dL (ref >39.00)
LDL Cholesterol: 114 mg/dL — ABNORMAL HIGH (ref 0–99)
NonHDL: 132.55
Total CHOL/HDL Ratio: 4
Triglycerides: 93 mg/dL (ref 0.0–149.0)
VLDL: 18.6 mg/dL (ref 0.0–40.0)

## 2024-03-14 LAB — HIV ANTIBODY (ROUTINE TESTING W REFLEX)
HIV 1&2 Ab, 4th Generation: NONREACTIVE
HIV FINAL INTERPRETATION: NEGATIVE

## 2024-03-14 LAB — HEPATITIS C ANTIBODY: Hepatitis C Ab: NONREACTIVE

## 2024-03-14 LAB — CBC WITH DIFFERENTIAL/PLATELET
Basophils Absolute: 0.1 K/uL (ref 0.0–0.1)
Basophils Relative: 1.1 % (ref 0.0–3.0)
Eosinophils Absolute: 0.1 K/uL (ref 0.0–0.7)
Eosinophils Relative: 2.3 % (ref 0.0–5.0)
HCT: 43 % (ref 39.0–52.0)
Hemoglobin: 14.1 g/dL (ref 13.0–17.0)
Lymphocytes Relative: 31.6 % (ref 12.0–46.0)
Lymphs Abs: 2 K/uL (ref 0.7–4.0)
MCHC: 32.7 g/dL (ref 30.0–36.0)
MCV: 83.3 fl (ref 78.0–100.0)
Monocytes Absolute: 0.3 K/uL (ref 0.1–1.0)
Monocytes Relative: 5.5 % (ref 3.0–12.0)
Neutro Abs: 3.7 K/uL (ref 1.4–7.7)
Neutrophils Relative %: 59.5 % (ref 43.0–77.0)
Platelets: 163 K/uL (ref 150.0–400.0)
RBC: 5.16 Mil/uL (ref 4.22–5.81)
RDW: 12.7 % (ref 11.5–15.5)
WBC: 6.2 K/uL (ref 4.0–10.5)

## 2024-03-14 LAB — RPR: RPR Ser Ql: NONREACTIVE

## 2024-03-16 LAB — GC/CHLAMYDIA PROBE AMP
Chlamydia trachomatis, NAA: NEGATIVE
Neisseria Gonorrhoeae by PCR: NEGATIVE

## 2024-04-11 ENCOUNTER — Other Ambulatory Visit (HOSPITAL_BASED_OUTPATIENT_CLINIC_OR_DEPARTMENT_OTHER)

## 2024-05-22 ENCOUNTER — Telehealth: Payer: Self-pay

## 2024-05-22 ENCOUNTER — Ambulatory Visit: Payer: Self-pay

## 2024-05-22 DIAGNOSIS — R079 Chest pain, unspecified: Secondary | ICD-10-CM

## 2024-05-22 DIAGNOSIS — Z8249 Family history of ischemic heart disease and other diseases of the circulatory system: Secondary | ICD-10-CM

## 2024-05-22 NOTE — Telephone Encounter (Signed)
 Copied from CRM #[id number]. Topic: Referral - Request for Referral >> May 22, 2024  4:32 PM Alfonso ORN wrote: Did the patient discuss referral with their provider in the last year? Yes   Appointment offered? Yes  Type of order/referral and detailed reason for visit:  [name]   Preference of office, provider, location: [name]   If referral order, have you been seen by this specialty before? No   Can we respond through MyChart? Yes

## 2024-05-22 NOTE — Telephone Encounter (Signed)
 Copied from CRM #[id number]. Topic: Clinical - Red Word Triage >> May 22, 2024  4:34 PM Alfonso ORN wrote: Red Word that prompted transfer to Nurse Triage: foot pain in heel been taking OTC , extreme pain in the morning     callback requested: [id number]

## 2024-05-22 NOTE — Telephone Encounter (Signed)
 FYI Only or Action Required?: FYI only for provider: appointment scheduled on 12/22.  Patient was last seen in primary care on 03/13/2024 by Purcell Emil Schanz, MD.  Called Nurse Triage reporting Foot Pain.  Symptoms began about a month ago.  Interventions attempted: OTC medications: tylenol.  Symptoms are: gradually worsening.  Triage Disposition: See PCP When Office is Open (Within 3 Days)  Patient/caregiver understands and will follow disposition?: Yes  Copied from CRM #[id number]. Topic: Clinical - Red Word Triage >> May 22, 2024  4:34 PM Alfonso ORN wrote: Red Word that prompted transfer to Nurse Triage: foot pain in heel been taking OTC , extreme pain in the morning     callback requested: [id number]    Reason for Disposition  [1] MODERATE pain (e.g., interferes with normal activities, limping) AND [2] present > 3 days  Answer Assessment - Initial Assessment Questions Santina running one day about a month ago and since then he has had heel pain and foot stiffness in the morning. Slight swelling to the heel. Denies color or temp changes,numbness tingling, fever, or hot hard knot.  Unsure if shoes or work shoes worsening  Appt with PCP office on Monday 12/22  to assess. ED/UC precautions understood  1. ONSET: When did the pain start?      One month  2. LOCATION: Where is the pain located?      Right heel  3. PAIN: How bad is the pain?    (Scale 1-10; or mild, moderate, severe)     4/10- when wakes and with activity  4. WORK OR EXERCISE: Has there been any recent work or exercise that involved this part of the body?      Running  5. CAUSE: What do you think is causing the foot pain?     unsure 6. OTHER SYMPTOMS: Do you have any other symptoms? (e.g., leg pain, rash, fever, numbness)     Redness and mild swelling. Denies blister, color change, temp change  Protocols used: Foot Pain-A-AH

## 2024-05-23 NOTE — Telephone Encounter (Signed)
Okay to refer again.

## 2024-05-23 NOTE — Addendum Note (Signed)
 Addended by: ROSALVA LEX RAMAN on: 05/23/2024 03:02 PM   Modules accepted: Orders

## 2024-05-23 NOTE — Telephone Encounter (Signed)
 Referral has been placed.

## 2024-05-23 NOTE — Telephone Encounter (Signed)
 Please advise if this ok to place. I do see you have placed one for cardiology a year ago

## 2024-05-26 ENCOUNTER — Ambulatory Visit

## 2024-05-26 ENCOUNTER — Encounter: Payer: Self-pay | Admitting: Family Medicine

## 2024-05-26 ENCOUNTER — Ambulatory Visit (INDEPENDENT_AMBULATORY_CARE_PROVIDER_SITE_OTHER): Admitting: Family Medicine

## 2024-05-26 VITALS — BP 112/80 | HR 65 | Temp 98.6°F | Ht 68.0 in | Wt 192.0 lb

## 2024-05-26 DIAGNOSIS — M79671 Pain in right foot: Secondary | ICD-10-CM | POA: Diagnosis not present

## 2024-05-26 DIAGNOSIS — M7731 Calcaneal spur, right foot: Secondary | ICD-10-CM

## 2024-05-26 DIAGNOSIS — M722 Plantar fascial fibromatosis: Secondary | ICD-10-CM | POA: Diagnosis not present

## 2024-05-26 NOTE — Progress Notes (Signed)
 "  Acute Office Visit  Subjective:     Patient ID: Evan Randall, male    DOB: March 09, 1986, 38 y.o.   MRN: [id number]  Chief Complaint  Patient presents with   Acute Visit    Right heel pain and swelling ongoing for 2 mths pain level 3 taking tylenol PRN for some relief     HPI  Discussed the use of AI scribe software for clinical note transcription with the patient, who gave verbal consent to proceed.  History of Present Illness Evan Randall is a 38 year old male who presents with right heel pain and swelling.  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  Constitutional:      General: He is not in acute distress.    Appearance: Normal appearance.  HENT:     Head: Normocephalic and atraumatic.     Right Ear: External ear normal.     Left Ear: External ear normal.     Nose: Nose normal.     Mouth/Throat:     Mouth: Mucous membranes are moist.     Pharynx: Oropharynx is clear.  Eyes:     Extraocular Movements: Extraocular movements intact.  Cardiovascular:     Rate and Rhythm: Normal rate and regular rhythm.     Pulses: Normal pulses.  Pulmonary:     Effort:  Pulmonary effort is normal.  Musculoskeletal:        General: Normal range of motion.     Cervical back: Normal range of motion.     Right lower leg: No edema.     Left lower leg: No edema.       Feet:  Feet:     Comments: Area of tenderness, minimal swelling Lymphadenopathy:     Cervical: No cervical adenopathy.  Skin:    General: Skin is warm and dry.  Neurological:     General: No focal deficit present.     Mental Status: He is alert and oriented to person, place, and time.  Psychiatric:        Mood and Affect: Mood normal.        Behavior: Behavior normal.     No results found for any visits on 05/26/24.      Assessment & Plan:   Assessment and Plan Assessment & Plan Right heel pain, plantar fasciits, right calcaneal spur Chronic pain for two months, likely due to occupational demands. Differential includes stress fracture. - Ordered X-ray of the right foot to evaluate for stress fracture. - Discussed having foot  scan with [facility] and getting more supportive shoes and insoles. - Rehab exercises given for plantar fasciitis - If insoles and exercises are not helpful, we will try a round of steroids     Orders Placed This Encounter  Procedures   DG Foot Complete Right    Standing Status:   Future    Number of Occurrences:   1    Expiration Date:   05/26/2025    Reason for Exam (SYMPTOM  OR DIAGNOSIS REQUIRED):   right heel pain    Preferred imaging location?:   [facility]     No orders of the defined types were placed in this encounter.   Return if symptoms worsen or fail to improve.  Corean LITTIE Ku, FNP  "

## 2024-05-26 NOTE — Patient Instructions (Signed)
 Recommend trying ibuprofen and having feet scanned at [facility].  We are getting an xray today. We will be in contact with any abnormal results that require further attention.  If this is not helpful, we can try a round of steroids.

## 2024-05-28 ENCOUNTER — Telehealth: Payer: Self-pay

## 2024-05-28 DIAGNOSIS — Z1211 Encounter for screening for malignant neoplasm of colon: Secondary | ICD-10-CM

## 2024-05-28 NOTE — Telephone Encounter (Signed)
 Copied from CRM [id number]. Topic: Clinical - Medical Advice >> May 28, 2024 11:45 AM Terri MATSU wrote: Reason for CRM: Patient wanted to get a colonoscopy because he stated his sister has colon cancer . Callback number   [phone]

## 2024-05-30 NOTE — Addendum Note (Signed)
 Addended by: ROSALVA LEX RAMAN on: 05/30/2024 10:33 AM   Modules accepted: Orders

## 2024-06-10 ENCOUNTER — Ambulatory Visit: Admitting: Emergency Medicine

## 2024-06-10 DIAGNOSIS — D229 Melanocytic nevi, unspecified: Secondary | ICD-10-CM

## 2024-06-11 NOTE — Addendum Note (Signed)
 Addended by: ROSALVA LEX RAMAN on: 06/11/2024 11:07 AM   Modules accepted: Orders

## 2024-06-12 ENCOUNTER — Ambulatory Visit: Payer: Self-pay | Admitting: Family Medicine

## 2024-06-19 NOTE — Progress Notes (Unsigned)
 "  Office Visit    Patient Name: KHA HARI Date of Encounter: 06/19/2024  Primary Care Provider:  Purcell Emil Schanz, MD Primary Cardiologist:  None  Chief Complaint    ***  Past Medical History    Past Medical History:  Diagnosis Date   High cholesterol    Past Surgical History:  Procedure Laterality Date   URETHRA SURGERY      Allergies  Allergies[1]   Labs/Other Studies Reviewed    The following studies were reviewed today: ***    Recent Labs: 03/13/2024: ALT 27; BUN 18; Creatinine, Ser 0.92; Hemoglobin 14.1; Platelets 163.0; Potassium 4.0; Sodium 136  Recent Lipid Panel    Component Value Date/Time   CHOL 183 03/13/2024 1644   TRIG 93.0 03/13/2024 1644   HDL 50.00 03/13/2024 1644   CHOLHDL 4 03/13/2024 1644   VLDL 18.6 03/13/2024 1644   LDLCALC 114 (H) 03/13/2024 1644    History of Present Illness    39 year old male with a history of atypical chest pain, family history of CAD, and dyslipidemia who presents for heart first clinic new patient evaluation.  He saw his PCP on 03/13/2024 and reported nonspecific chest pain.  Coronary calcium score was recommended but not completed.  He presents today for heart first clinic new patient evaluation.   CC: -Initial onset:  -Frequency/Duration:  -Associated symptoms:  -Aggravating/alleviating factors:  -Syncope/near syncope: -Prior cardiac history: -Prior ECG:  -Prior workup: -Prior treatment:  -Possible medication interactions:  -Caffeine:  -Alcohol:  -Tobacco: -OTC supplements:  -Comorbidities:  -Exercise level:  -Labs:  -Cardiac ROS: no chest pain, no shortness of breath, no PND, no orthopnea, no LE edema. She denies any headaches. -Family history:   -Specialists:    Home Medications    Current Outpatient Medications  Medication Sig Dispense Refill   meloxicam  (MOBIC ) 15 MG tablet Take 1 tablet (15 mg total) by mouth daily as needed. 90 tablet 1   meloxicam  (MOBIC ) 15 MG tablet  Take 1 tablet (15 mg total) by mouth daily. (Patient not taking: Reported on 03/13/2024) 30 tablet 0   No current facility-administered medications for this visit.     Review of Systems    ***.  All other systems reviewed and are otherwise negative except as noted above.    Physical Exam    VS:  There were no vitals taken for this visit. , BMI There is no height or weight on file to calculate BMI.     GEN: Well nourished, well developed, in no acute distress. HEENT: normal. Neck: Supple, no JVD, carotid bruits, or masses. Cardiac: RRR, no murmurs, rubs, or gallops. No clubbing, cyanosis, edema.  Radials/DP/PT 2+ and equal bilaterally.  Respiratory:  Respirations regular and unlabored, clear to auscultation bilaterally. GI: Soft, nontender, nondistended, BS + x 4. MS: no deformity or atrophy. Skin: warm and dry, no rash. Neuro:  Strength and sensation are intact. Psych: Normal affect.  Accessory Clinical Findings    ECG personally reviewed by me today -    - no acute changes.   Lab Results  Component Value Date   WBC 6.2 03/13/2024   HGB 14.1 03/13/2024   HCT 43.0 03/13/2024   MCV 83.3 03/13/2024   PLT 163.0 03/13/2024   Lab Results  Component Value Date   CREATININE 0.92 03/13/2024   BUN 18 03/13/2024   NA 136 03/13/2024   K 4.0 03/13/2024   CL 104 03/13/2024   CO2 25 03/13/2024   Lab Results  Component  Value Date   ALT 27 03/13/2024   AST 36 03/13/2024   ALKPHOS 59 03/13/2024   BILITOT 0.4 03/13/2024   Lab Results  Component Value Date   CHOL 183 03/13/2024   HDL 50.00 03/13/2024   LDLCALC 114 (H) 03/13/2024   TRIG 93.0 03/13/2024   CHOLHDL 4 03/13/2024    Lab Results  Component Value Date   HGBA1C 5.9 06/20/2023    Assessment & Plan    1.  ***  No BP recorded.  {Refresh Note OR Click here to enter BP  :1}***   Damien JAYSON Braver, NP 06/19/2024, 4:22 PM       [1] No Known Allergies  "

## 2024-06-20 ENCOUNTER — Ambulatory Visit: Attending: Nurse Practitioner | Admitting: Nurse Practitioner

## 2024-06-23 ENCOUNTER — Encounter: Payer: BC Managed Care – PPO | Admitting: Emergency Medicine

## 2024-07-03 ENCOUNTER — Ambulatory Visit: Admitting: Internal Medicine

## 2024-07-09 ENCOUNTER — Encounter: Admitting: Emergency Medicine

## 2025-02-17 ENCOUNTER — Ambulatory Visit: Admitting: Physician Assistant
